# Patient Record
Sex: Female | Born: 1955 | Hispanic: No | Marital: Married | Smoking: Never smoker
Health system: Southern US, Community
[De-identification: ages and names within clinical notes are randomized; demographics above are authoritative.]

## PROBLEM LIST (undated history)

## (undated) DIAGNOSIS — E119 Type 2 diabetes mellitus without complications: Secondary | ICD-10-CM

## (undated) DIAGNOSIS — I10 Essential (primary) hypertension: Secondary | ICD-10-CM

## (undated) DIAGNOSIS — G4730 Sleep apnea, unspecified: Secondary | ICD-10-CM

## (undated) DIAGNOSIS — E079 Disorder of thyroid, unspecified: Secondary | ICD-10-CM

## (undated) HISTORY — PX: TENDON REPAIR: SHX5111

## (undated) HISTORY — DX: Type 2 diabetes mellitus without complications: E11.9

## (undated) HISTORY — DX: Sleep apnea, unspecified: G47.30

## (undated) HISTORY — DX: Disorder of thyroid, unspecified: E07.9

## (undated) HISTORY — DX: Essential (primary) hypertension: I10

---

## 2010-10-25 ENCOUNTER — Encounter (HOSPITAL_BASED_OUTPATIENT_CLINIC_OR_DEPARTMENT_OTHER)
Admission: RE | Admit: 2010-10-25 | Discharge: 2010-10-25 | Disposition: A | Discharge status: Home / Self Care | Payer: Self-pay | Source: Ambulatory Visit | Attending: General Surgery | Admitting: General Surgery

## 2010-10-25 DIAGNOSIS — Z01818 Encounter for other preprocedural examination: Secondary | ICD-10-CM | POA: Insufficient documentation

## 2010-10-25 DIAGNOSIS — Z01812 Encounter for preprocedural laboratory examination: Secondary | ICD-10-CM | POA: Insufficient documentation

## 2010-10-25 LAB — BASIC METABOLIC PANEL
CO2: 30 mEq/L (ref 19–32)
Calcium: 8.9 mg/dL (ref 8.4–10.5)
Chloride: 100 mEq/L (ref 96–112)
Glucose, Bld: 74 mg/dL (ref 70–99)
Potassium: 3.2 mEq/L — ABNORMAL LOW (ref 3.5–5.1)
Sodium: 137 mEq/L (ref 135–145)

## 2010-10-26 ENCOUNTER — Ambulatory Visit (HOSPITAL_BASED_OUTPATIENT_CLINIC_OR_DEPARTMENT_OTHER)
Admission: RE | Admit: 2010-10-26 | Discharge: 2010-10-27 | Disposition: A | Discharge status: Home / Self Care | Payer: Self-pay | Source: Ambulatory Visit | Attending: General Surgery | Admitting: General Surgery

## 2010-10-26 ENCOUNTER — Other Ambulatory Visit (INDEPENDENT_AMBULATORY_CARE_PROVIDER_SITE_OTHER): Payer: Self-pay | Admitting: General Surgery

## 2010-10-26 DIAGNOSIS — Z01812 Encounter for preprocedural laboratory examination: Secondary | ICD-10-CM | POA: Insufficient documentation

## 2010-10-26 DIAGNOSIS — D1739 Benign lipomatous neoplasm of skin and subcutaneous tissue of other sites: Secondary | ICD-10-CM

## 2010-10-26 DIAGNOSIS — J45909 Unspecified asthma, uncomplicated: Secondary | ICD-10-CM | POA: Insufficient documentation

## 2010-10-26 DIAGNOSIS — I10 Essential (primary) hypertension: Secondary | ICD-10-CM | POA: Insufficient documentation

## 2010-10-26 DIAGNOSIS — Z01810 Encounter for preprocedural cardiovascular examination: Secondary | ICD-10-CM | POA: Insufficient documentation

## 2010-10-26 LAB — POCT HEMOGLOBIN-HEMACUE: Hemoglobin: 13.3 g/dL (ref 12.0–15.0)

## 2010-11-07 NOTE — Op Note (Signed)
  [name], Brenda Adkins             ACCOUNT NO.:  [id number]  MEDICAL RECORD NO.:  [id number]  LOCATION:                                 FACILITY:  PHYSICIAN:  Lennie Muckle, MD      DATE OF BIRTH:  [birth year]  DATE OF PROCEDURE: DATE OF DISCHARGE:                              OPERATIVE REPORT   PREOPERATIVE DIAGNOSIS:  Lipoma, left thigh.  POSTOPERATIVE DIAGNOSIS:  Lipoma, left thigh.  PROCEDURE:  Excision of lipoma, left thigh.  SURGEON:  Lennie Muckle, MD  ASSISTANT:  OR staff.  FINDINGS:  A 2-cm lipoma on the left thigh, sent for permanent.  COMPLICATIONS:  No immediate complications.  BLOOD LOSS.:  Minimal amount of blood loss.  INDICATIONS FOR PROCEDURE:  [name] is a 55 year old female who had a growth on her left thigh for over a year.  It had grown slightly in size.  I had seen her last year for excision and I think it was financial difficulty that she had that she did not have this excised previously.  She states she is still bothered by the lesion and wants to remove due to the irritability.  DETAILS OF PROCEDURE:  The patient was identified in the preoperative holding area.  I marked the area while the patient awake and conversation with her daughters who were interpreting.  She was seen by Anesthesia and taken to the operating room.  Once in the operating room, she was placed in supine position.  She was placed under general endotracheal anesthesia.  Her left thigh was prepped and draped in the usual sterile fashion.  A surgical time-out was performed.  I began by placing an elliptical incision around the lipoma site.  I incorporated the small portion of the skin..  I then used electrocautery to remove the specimen intact.  The specimen was passed off the operative field. There was minimal amount of oozing.  I used electrocautery to control this.  I then reapproximated the subcutaneous tissues with a 3-0 Vicryl. Dermis was closed with 0 Vicryl.  I  placed Steri-Strips and then placed dry gauze Tegaderm.  I injected 10 mL of 0.25% Marcaine with epinephrine for local block.  The patient tolerated the procedure well and was extubated and taken to the Postanesthesia Care Unit in stable condition.  She will be discharged home with Vicodin and will follow up in 2-3 weeks for a postoperative visit.     Lennie Muckle, MD     ALA/MEDQ  D:  10/26/2010  T:  10/27/2010  Job:  [id number]  Electronically Signed by Bertram Savin MD on 11/07/2010 11:42:37 AM

## 2010-11-09 ENCOUNTER — Encounter (INDEPENDENT_AMBULATORY_CARE_PROVIDER_SITE_OTHER): Payer: Self-pay | Admitting: General Surgery

## 2010-11-09 ENCOUNTER — Ambulatory Visit (INDEPENDENT_AMBULATORY_CARE_PROVIDER_SITE_OTHER): Payer: Self-pay | Admitting: General Surgery

## 2010-11-09 VITALS — Temp 97.4°F

## 2010-11-09 DIAGNOSIS — D1779 Benign lipomatous neoplasm of other sites: Secondary | ICD-10-CM

## 2010-11-09 DIAGNOSIS — D1720 Benign lipomatous neoplasm of skin and subcutaneous tissue of unspecified limb: Secondary | ICD-10-CM | POA: Insufficient documentation

## 2010-11-09 NOTE — Progress Notes (Signed)
Subjective:     Patient ID: Brenda Adkins, female   DOB: [birth year], 55 y.o.   MRN: [id number]    Temp(Src) 97.4 F (36.3 C) (Temporal)    HPI Doing well. No pain.  Review of Systems     Objective:   Physical Exam Wound on l thigh has no signs of infection and is healing well    Assessment:     Doing well from L thigh lipoma excision.    Plan:     Follow up PRN

## 2015-02-28 ENCOUNTER — Other Ambulatory Visit (HOSPITAL_COMMUNITY)
Admission: RE | Admit: 2015-02-28 | Discharge: 2015-02-28 | Disposition: A | Discharge status: Home / Self Care | Payer: 59 | Source: Ambulatory Visit | Attending: Obstetrics & Gynecology | Admitting: Obstetrics & Gynecology

## 2015-02-28 ENCOUNTER — Encounter: Payer: Self-pay | Admitting: Obstetrics & Gynecology

## 2015-02-28 ENCOUNTER — Ambulatory Visit (INDEPENDENT_AMBULATORY_CARE_PROVIDER_SITE_OTHER): Payer: 59 | Admitting: Obstetrics & Gynecology

## 2015-02-28 VITALS — BP 117/76 | HR 89 | Ht 60.0 in | Wt 218.0 lb

## 2015-02-28 DIAGNOSIS — Z1231 Encounter for screening mammogram for malignant neoplasm of breast: Secondary | ICD-10-CM

## 2015-02-28 DIAGNOSIS — Z113 Encounter for screening for infections with a predominantly sexual mode of transmission: Secondary | ICD-10-CM

## 2015-02-28 DIAGNOSIS — N950 Postmenopausal bleeding: Secondary | ICD-10-CM | POA: Insufficient documentation

## 2015-02-28 DIAGNOSIS — Z1151 Encounter for screening for human papillomavirus (HPV): Secondary | ICD-10-CM | POA: Diagnosis not present

## 2015-02-28 DIAGNOSIS — Z01419 Encounter for gynecological examination (general) (routine) without abnormal findings: Secondary | ICD-10-CM

## 2015-02-28 DIAGNOSIS — Z124 Encounter for screening for malignant neoplasm of cervix: Secondary | ICD-10-CM

## 2015-02-28 NOTE — Progress Notes (Signed)
GYNECOLOGY CLINIC ANNUAL PREVENTATIVE CARE ENCOUNTER NOTE  Subjective:   Brenda Adkins is a 59 y.o. PMP  female here for a routine annual gynecologic exam.  Current complaints: postmenopausal bleeding.  Had menopause around 2005; no bleeding or any gynecologic problems until last month when she had an episode of bleeding that lasted for four days.  Bleeding was moderate; used 2 pads/day. Associated with cramping.  Bleeding stopped since then; denies current abnormal vaginal bleeding, discharge, pelvic pain, problems with intercourse or other gynecologic concerns.    Gynecologic History No LMP recorded. Patient is postmenopausal. Contraception: post menopausal status Last Pap: 24 years ago. Results were: normal Last mammogram: several years ago. Results were: normal  Obstetric History OB History  No data available    Past Medical History  Diagnosis Date  . Hypertension   . Diabetes mellitus without complication [facility])     Past Surgical History  Procedure Laterality Date  . Tendon repair Right     Current Outpatient Prescriptions on File Prior to Visit  Medication Sig Dispense Refill  . losartan-hydrochlorothiazide (HYZAAR) 100-12.5 MG per tablet Take 1 tablet by mouth daily.      No current facility-administered medications on file prior to visit.    No Known Allergies  Social History   Social History  . Marital Status: Married    Spouse Name: N/A  . Number of Children: N/A  . Years of Education: N/A   Occupational History  . Not on file.   Social History Main Topics  . Smoking status: Never Smoker   . Smokeless tobacco: Not on file  . Alcohol Use: No  . Drug Use: No  . Sexual Activity: Not on file   Other Topics Concern  . Not on file   Social History Narrative    Family History  Problem Relation Age of Onset  . Hypertension Mother   . Diabetes Mother   . Cancer Neg Hx     The following portions of the patient's history were reviewed and  updated as appropriate: allergies, current medications, past family history, past medical history, past social history, past surgical history and problem list.  Review of Systems Pertinent items noted in HPI and remainder of comprehensive ROS otherwise negative.   Objective:  BP 117/76 mmHg  Pulse 89  Ht 5' (1.524 m)  Wt 218 lb (98.884 kg)  BMI 42.58 kg/m2 CONSTITUTIONAL: Well-developed, well-nourished female in no acute distress.  HENT:  Normocephalic, atraumatic, External right and left ear normal. Oropharynx is clear and moist EYES: Conjunctivae and EOM are normal. Pupils are equal, round, and reactive to light. No scleral icterus.  NECK: Normal range of motion, supple, no masses.  Normal thyroid.  SKIN: Skin is warm and dry. No rash noted. Not diaphoretic. No erythema. No pallor. [place]: Alert and oriented to person, place, and time. Normal reflexes, muscle tone coordination. No cranial nerve deficit noted. PSYCHIATRIC: Normal mood and affect. Normal behavior. Normal judgment and thought content. CARDIOVASCULAR: Normal heart rate noted, regular rhythm RESPIRATORY: Clear to auscultation bilaterally. Effort and breath sounds normal, no problems with respiration noted. BREASTS: Symmetric in size. No masses, skin changes, nipple drainage, or lymphadenopathy. ABDOMEN: Soft, obese, normal bowel sounds, no distention appreciated.  No tenderness, rebound or guarding.  PELVIC: Normal appearing external genitalia; normal appearing vaginal mucosa and cervix.  Normal appearing discharge.  Pap smear obtained.  Unable to palpate uterus or adnexa secondary to habitus.  MUSCULOSKELETAL: Normal range of motion. No tenderness.  No  cyanosis, clubbing, or edema.  2+ distal pulses.  ENDOMETRIAL BIOPSY     The indications for endometrial biopsy were reviewed.   Risks of the biopsy including cramping, bleeding, infection, uterine perforation, inadequate specimen and need for additional procedures  were  discussed. The patient states she understands and agrees to undergo procedure today. Consent was signed. Time out was performed.  During the pelvic exam, the cervix was prepped with Betadine. A single-toothed tenaculum was placed on the anterior lip of the cervix to stabilize it. The 3 mm pipelle was introduced into the endometrial cavity without difficulty to a depth of 8 cm, and a scant amount of tissue was obtained after two passes and sent to pathology. The instruments were removed from the patient's vagina. Minimal bleeding from the cervix was noted. The patient tolerated the procedure well. Routine post-procedure instructions were given to the patient.     Assessment:  Annual gynecologic examination with pap smear Postmenopausal bleeding   Plan:  Discussed etiologies of postmenopausal bleeding, concern about precancerous/hyperplasia or cancerous etiology (5 to 10% percent of cases).   However, she was reassured that endometrial atrophy and endometrial polyps are the most common causes of postmenopausal bleeding.  Uterine bleeding in postmenopausal women is usually light and self-limited. Exclusion of cancer is the main objective; therefore, treatment is usually unnecessary once cancer has been excluded.  The primary goal in the diagnostic evaluation of postmenopausal women with uterine bleeding is to exclude malignancy; this can include endometrial biopsy and pelvic ultrasound.   Further diagnostic evaluation is indicated for recurrent or persistent bleeding  Will follow up results of pap smear and endometrial biopsy and manage accordingly. Pelvic ultrasound and mammogram scheduled, will follow up results and manage accordingly. Routine preventative health maintenance measures emphasized; will follow up with PCP.Marland Kitchen Please refer to After Visit Summary for other counseling recommendations.    Verita Schneiders, MD, [place] Attending Obstetrician & Gynecologist, [place] [facility]

## 2015-02-28 NOTE — Patient Instructions (Signed)
Preventive Care for Adults, Female A healthy lifestyle and preventive care can promote health and wellness. Preventive health guidelines for women include the following key practices.  A routine yearly physical is a good way to check with your health care provider about your health and preventive screening. It is a chance to share any concerns and updates on your health and to receive a thorough exam.  Visit your dentist for a routine exam and preventive care every 6 months. Brush your teeth twice a day and floss once a day. Good oral hygiene prevents tooth decay and gum disease.  The frequency of eye exams is based on your age, health, family medical history, use of contact lenses, and other factors. Follow your health care provider's recommendations for frequency of eye exams.  Eat a healthy diet. Foods like vegetables, fruits, whole grains, low-fat dairy products, and lean protein foods contain the nutrients you need without too many calories. Decrease your intake of foods high in solid fats, added sugars, and salt. Eat the right amount of calories for you.Get information about a proper diet from your health care provider, if necessary.  Regular physical exercise is one of the most important things you can do for your health. Most adults should get at least 150 minutes of moderate-intensity exercise (any activity that increases your heart rate and causes you to sweat) each week. In addition, most adults need muscle-strengthening exercises on 2 or more days a week.  Maintain a healthy weight. The body mass index (BMI) is a screening tool to identify possible weight problems. It provides an estimate of body fat based on height and weight. Your health care provider can find your BMI and can help you achieve or maintain a healthy weight.For adults 20 years and older:  A BMI below 18.5 is considered underweight.  A BMI of 18.5 to 24.9 is normal.  A BMI of 25 to 29.9 is considered overweight.  A  BMI of 30 and above is considered obese.  Maintain normal blood lipids and cholesterol levels by exercising and minimizing your intake of saturated fat. Eat a balanced diet with plenty of fruit and vegetables. Blood tests for lipids and cholesterol should begin at age 45 and be repeated every 5 years. If your lipid or cholesterol levels are high, you are over 50, or you are at high risk for heart disease, you may need your cholesterol levels checked more frequently.Ongoing high lipid and cholesterol levels should be treated with medicines if diet and exercise are not working.  If you smoke, find out from your health care provider how to quit. If you do not use tobacco, do not start.  Lung cancer screening is recommended for adults aged 45-80 years who are at high risk for developing lung cancer because of a history of smoking. A yearly low-dose CT scan of the lungs is recommended for people who have at least a 30-pack-year history of smoking and are a current smoker or have quit within the past 15 years. A pack year of smoking is smoking an average of 1 pack of cigarettes a day for 1 year (for example: 1 pack a day for 30 years or 2 packs a day for 15 years). Yearly screening should continue until the smoker has stopped smoking for at least 15 years. Yearly screening should be stopped for people who develop a health problem that would prevent them from having lung cancer treatment.  If you are pregnant, do not drink alcohol. If you are  breastfeeding, be very cautious about drinking alcohol. If you are not pregnant and choose to drink alcohol, do not have more than 1 drink per day. One drink is considered to be 12 ounces (355 mL) of beer, 5 ounces (148 mL) of wine, or 1.5 ounces (44 mL) of liquor.  Avoid use of street drugs. Do not share needles with anyone. Ask for help if you need support or instructions about stopping the use of drugs.  High blood pressure causes heart disease and increases the risk  of stroke. Your blood pressure should be checked at least every 1 to 2 years. Ongoing high blood pressure should be treated with medicines if weight loss and exercise do not work.  If you are 55-79 years old, ask your health care provider if you should take aspirin to prevent strokes.  Diabetes screening is done by taking a blood sample to check your blood glucose level after you have not eaten for a certain period of time (fasting). If you are not overweight and you do not have risk factors for diabetes, you should be screened once every 3 years starting at age 45. If you are overweight or obese and you are 40-70 years of age, you should be screened for diabetes every year as part of your cardiovascular risk assessment.  Breast cancer screening is essential preventive care for women. You should practice "breast self-awareness." This means understanding the normal appearance and feel of your breasts and may include breast self-examination. Any changes detected, no matter how small, should be reported to a health care provider. Women in their 20s and 30s should have a clinical breast exam (CBE) by a health care provider as part of a regular health exam every 1 to 3 years. After age 40, women should have a CBE every year. Starting at age 40, women should consider having a mammogram (breast X-ray test) every year. Women who have a family history of breast cancer should talk to their health care provider about genetic screening. Women at a high risk of breast cancer should talk to their health care providers about having an MRI and a mammogram every year.  Breast cancer gene (BRCA)-related cancer risk assessment is recommended for women who have family members with BRCA-related cancers. BRCA-related cancers include breast, ovarian, tubal, and peritoneal cancers. Having family members with these cancers may be associated with an increased risk for harmful changes (mutations) in the breast cancer genes BRCA1 and  BRCA2. Results of the assessment will determine the need for genetic counseling and BRCA1 and BRCA2 testing.  Your health care provider may recommend that you be screened regularly for cancer of the pelvic organs (ovaries, uterus, and vagina). This screening involves a pelvic examination, including checking for microscopic changes to the surface of your cervix (Pap test). You may be encouraged to have this screening done every 3 years, beginning at age 21.  For women ages 30-65, health care providers may recommend pelvic exams and Pap testing every 3 years, or they may recommend the Pap and pelvic exam, combined with testing for human papilloma virus (HPV), every 5 years. Some types of HPV increase your risk of cervical cancer. Testing for HPV may also be done on women of any age with unclear Pap test results.  Other health care providers may not recommend any screening for nonpregnant women who are considered low risk for pelvic cancer and who do not have symptoms. Ask your health care provider if a screening pelvic exam is right for   you.  If you have had past treatment for cervical cancer or a condition that could lead to cancer, you need Pap tests and screening for cancer for at least 20 years after your treatment. If Pap tests have been discontinued, your risk factors (such as having a new sexual partner) need to be reassessed to determine if screening should resume. Some women have medical problems that increase the chance of getting cervical cancer. In these cases, your health care provider may recommend more frequent screening and Pap tests.  Colorectal cancer can be detected and often prevented. Most routine colorectal cancer screening begins at the age of 50 years and continues through age 75 years. However, your health care provider may recommend screening at an earlier age if you have risk factors for colon cancer. On a yearly basis, your health care provider may provide home test kits to check  for hidden blood in the stool. Use of a small camera at the end of a tube, to directly examine the colon (sigmoidoscopy or colonoscopy), can detect the earliest forms of colorectal cancer. Talk to your health care provider about this at age 50, when routine screening begins. Direct exam of the colon should be repeated every 5-10 years through age 75 years, unless early forms of precancerous polyps or small growths are found.  People who are at an increased risk for hepatitis B should be screened for this virus. You are considered at high risk for hepatitis B if:  You were born in a country where hepatitis B occurs often. Talk with your health care provider about which countries are considered high risk.  Your parents were born in a high-risk country and you have not received a shot to protect against hepatitis B (hepatitis B vaccine).  You have HIV or AIDS.  You use needles to inject street drugs.  You live with, or have sex with, someone who has hepatitis B.  You get hemodialysis treatment.  You take certain medicines for conditions like cancer, organ transplantation, and autoimmune conditions.  Hepatitis C blood testing is recommended for all people born from 1945 through 1965 and any individual with known risks for hepatitis C.  Practice safe sex. Use condoms and avoid high-risk sexual practices to reduce the spread of sexually transmitted infections (STIs). STIs include gonorrhea, chlamydia, syphilis, trichomonas, herpes, HPV, and human immunodeficiency virus (HIV). Herpes, HIV, and HPV are viral illnesses that have no cure. They can result in disability, cancer, and death.  You should be screened for sexually transmitted illnesses (STIs) including gonorrhea and chlamydia if:  You are sexually active and are younger than 24 years.  You are older than 24 years and your health care provider tells you that you are at risk for this type of infection.  Your sexual activity has changed  since you were last screened and you are at an increased risk for chlamydia or gonorrhea. Ask your health care provider if you are at risk.  If you are at risk of being infected with HIV, it is recommended that you take a prescription medicine daily to prevent HIV infection. This is called preexposure prophylaxis (PrEP). You are considered at risk if:  You are sexually active and do not regularly use condoms or know the HIV status of your partner(s).  You take drugs by injection.  You are sexually active with a partner who has HIV.  Talk with your health care provider about whether you are at high risk of being infected with HIV. If   you choose to begin PrEP, you should first be tested for HIV. You should then be tested every 3 months for as long as you are taking PrEP.  Osteoporosis is a disease in which the bones lose minerals and strength with aging. This can result in serious bone fractures or breaks. The risk of osteoporosis can be identified using a bone density scan. Women ages 67 years and over and women at risk for fractures or osteoporosis should discuss screening with their health care providers. Ask your health care provider whether you should take a calcium supplement or vitamin D to reduce the rate of osteoporosis.  Menopause can be associated with physical symptoms and risks. Hormone replacement therapy is available to decrease symptoms and risks. You should talk to your health care provider about whether hormone replacement therapy is right for you.  Use sunscreen. Apply sunscreen liberally and repeatedly throughout the day. You should seek shade when your shadow is shorter than you. Protect yourself by wearing long sleeves, pants, a wide-brimmed hat, and sunglasses year round, whenever you are outdoors.  Once a month, do a whole body skin exam, using a mirror to look at the skin on your back. Tell your health care provider of new moles, moles that have irregular borders, moles that  are larger than a pencil eraser, or moles that have changed in shape or color.  Stay current with required vaccines (immunizations).  Influenza vaccine. All adults should be immunized every year.  Tetanus, diphtheria, and acellular pertussis (Td, Tdap) vaccine. Pregnant women should receive 1 dose of Tdap vaccine during each pregnancy. The dose should be obtained regardless of the length of time since the last dose. Immunization is preferred during the 27th-36th week of gestation. An adult who has not previously received Tdap or who does not know her vaccine status should receive 1 dose of Tdap. This initial dose should be followed by tetanus and diphtheria toxoids (Td) booster doses every 10 years. Adults with an unknown or incomplete history of completing a 3-dose immunization series with Td-containing vaccines should begin or complete a primary immunization series including a Tdap dose. Adults should receive a Td booster every 10 years.  Varicella vaccine. An adult without evidence of immunity to varicella should receive 2 doses or a second dose if she has previously received 1 dose. Pregnant females who do not have evidence of immunity should receive the first dose after pregnancy. This first dose should be obtained before leaving the health care facility. The second dose should be obtained 4-8 weeks after the first dose.  Human papillomavirus (HPV) vaccine. Females aged 13-26 years who have not received the vaccine previously should obtain the 3-dose series. The vaccine is not recommended for use in pregnant females. However, pregnancy testing is not needed before receiving a dose. If a female is found to be pregnant after receiving a dose, no treatment is needed. In that case, the remaining doses should be delayed until after the pregnancy. Immunization is recommended for any person with an immunocompromised condition through the age of 61 years if she did not get any or all doses earlier. During the  3-dose series, the second dose should be obtained 4-8 weeks after the first dose. The third dose should be obtained 24 weeks after the first dose and 16 weeks after the second dose.  Zoster vaccine. One dose is recommended for adults aged 30 years or older unless certain conditions are present.  Measles, mumps, and rubella (MMR) vaccine. Adults born  before 1957 generally are considered immune to measles and mumps. Adults born in 1957 or later should have 1 or more doses of MMR vaccine unless there is a contraindication to the vaccine or there is laboratory evidence of immunity to each of the three diseases. A routine second dose of MMR vaccine should be obtained at least 28 days after the first dose for students attending postsecondary schools, health care workers, or international travelers. People who received inactivated measles vaccine or an unknown type of measles vaccine during 1963-1967 should receive 2 doses of MMR vaccine. People who received inactivated mumps vaccine or an unknown type of mumps vaccine before 1979 and are at high risk for mumps infection should consider immunization with 2 doses of MMR vaccine. For females of childbearing age, rubella immunity should be determined. If there is no evidence of immunity, females who are not pregnant should be vaccinated. If there is no evidence of immunity, females who are pregnant should delay immunization until after pregnancy. Unvaccinated health care workers born before 1957 who lack laboratory evidence of measles, mumps, or rubella immunity or laboratory confirmation of disease should consider measles and mumps immunization with 2 doses of MMR vaccine or rubella immunization with 1 dose of MMR vaccine.  Pneumococcal 13-valent conjugate (PCV13) vaccine. When indicated, a person who is uncertain of his immunization history and has no record of immunization should receive the PCV13 vaccine. All adults 65 years of age and older should receive this  vaccine. An adult aged 19 years or older who has certain medical conditions and has not been previously immunized should receive 1 dose of PCV13 vaccine. This PCV13 should be followed with a dose of pneumococcal polysaccharide (PPSV23) vaccine. Adults who are at high risk for pneumococcal disease should obtain the PPSV23 vaccine at least 8 weeks after the dose of PCV13 vaccine. Adults older than 59 years of age who have normal immune system function should obtain the PPSV23 vaccine dose at least 1 year after the dose of PCV13 vaccine.  Pneumococcal polysaccharide (PPSV23) vaccine. When PCV13 is also indicated, PCV13 should be obtained first. All adults aged 65 years and older should be immunized. An adult younger than age 65 years who has certain medical conditions should be immunized. Any person who resides in a nursing home or long-term care facility should be immunized. An adult smoker should be immunized. People with an immunocompromised condition and certain other conditions should receive both PCV13 and PPSV23 vaccines. People with human immunodeficiency virus (HIV) infection should be immunized as soon as possible after diagnosis. Immunization during chemotherapy or radiation therapy should be avoided. Routine use of PPSV23 vaccine is not recommended for American Indians, [place] Natives, or people younger than 65 years unless there are medical conditions that require PPSV23 vaccine. When indicated, people who have unknown immunization and have no record of immunization should receive PPSV23 vaccine. One-time revaccination 5 years after the first dose of PPSV23 is recommended for people aged 19-64 years who have chronic kidney failure, nephrotic syndrome, asplenia, or immunocompromised conditions. People who received 1-2 doses of PPSV23 before age 65 years should receive another dose of PPSV23 vaccine at age 65 years or later if at least 5 years have passed since the previous dose. Doses of PPSV23 are not  needed for people immunized with PPSV23 at or after age 65 years.  Meningococcal vaccine. Adults with asplenia or persistent complement component deficiencies should receive 2 doses of quadrivalent meningococcal conjugate (MenACWY-D) vaccine. The doses should be obtained   at least 2 months apart. Microbiologists working with certain meningococcal bacteria, [place] recruits, people at risk during an outbreak, and people who travel to or live in countries with a high rate of meningitis should be immunized. A first-year college student up through age 34 years who is living in a residence hall should receive a dose if she did not receive a dose on or after her 16th birthday. Adults who have certain high-risk conditions should receive one or more doses of vaccine.  Hepatitis A vaccine. Adults who wish to be protected from this disease, have certain high-risk conditions, work with hepatitis A-infected animals, work in hepatitis A research labs, or travel to or work in countries with a high rate of hepatitis A should be immunized. Adults who were previously unvaccinated and who anticipate close contact with an international adoptee during the first 60 days after arrival in the [place] from a country with a high rate of hepatitis A should be immunized.  Hepatitis B vaccine. Adults who wish to be protected from this disease, have certain high-risk conditions, may be exposed to blood or other infectious body fluids, are household contacts or sex partners of hepatitis B positive people, are clients or workers in certain care facilities, or travel to or work in countries with a high rate of hepatitis B should be immunized.  Haemophilus influenzae type b (Hib) vaccine. A previously unvaccinated person with asplenia or sickle cell disease or having a scheduled splenectomy should receive 1 dose of Hib vaccine. Regardless of previous immunization, a recipient of a hematopoietic stem cell transplant should receive a  3-dose series 6-12 months after her successful transplant. Hib vaccine is not recommended for adults with HIV infection. Preventive Services / Frequency Ages 35 to 4 years  Blood pressure check.** / Every 3-5 years.  Lipid and cholesterol check.** / Every 5 years beginning at age 60.  Clinical breast exam.** / Every 3 years for women in their 71s and 10s.  BRCA-related cancer risk assessment.** / For women who have family members with a BRCA-related cancer (breast, ovarian, tubal, or peritoneal cancers).  Pap test.** / Every 2 years from ages 76 through 26. Every 3 years starting at age 61 through age 76 or 93 with a history of 3 consecutive normal Pap tests.  HPV screening.** / Every 3 years from ages 37 through ages 60 to 51 with a history of 3 consecutive normal Pap tests.  Hepatitis C blood test.** / For any individual with known risks for hepatitis C.  Skin self-exam. / Monthly.  Influenza vaccine. / Every year.  Tetanus, diphtheria, and acellular pertussis (Tdap, Td) vaccine.** / Consult your health care provider. Pregnant women should receive 1 dose of Tdap vaccine during each pregnancy. 1 dose of Td every 10 years.  Varicella vaccine.** / Consult your health care provider. Pregnant females who do not have evidence of immunity should receive the first dose after pregnancy.  HPV vaccine. / 3 doses over 6 months, if 93 and younger. The vaccine is not recommended for use in pregnant females. However, pregnancy testing is not needed before receiving a dose.  Measles, mumps, rubella (MMR) vaccine.** / You need at least 1 dose of MMR if you were born in 1957 or later. You may also need a 2nd dose. For females of childbearing age, rubella immunity should be determined. If there is no evidence of immunity, females who are not pregnant should be vaccinated. If there is no evidence of immunity, females who are  pregnant should delay immunization until after pregnancy.  Pneumococcal  13-valent conjugate (PCV13) vaccine.** / Consult your health care provider.  Pneumococcal polysaccharide (PPSV23) vaccine.** / 1 to 2 doses if you smoke cigarettes or if you have certain conditions.  Meningococcal vaccine.** / 1 dose if you are age 68 to 8 years and a Market researcher living in a residence hall, or have one of several medical conditions, you need to get vaccinated against meningococcal disease. You may also need additional booster doses.  Hepatitis A vaccine.** / Consult your health care provider.  Hepatitis B vaccine.** / Consult your health care provider.  Haemophilus influenzae type b (Hib) vaccine.** / Consult your health care provider. Ages 7 to 53 years  Blood pressure check.** / Every year.  Lipid and cholesterol check.** / Every 5 years beginning at age 25 years.  Lung cancer screening. / Every year if you are aged 11-80 years and have a 30-pack-year history of smoking and currently smoke or have quit within the past 15 years. Yearly screening is stopped once you have quit smoking for at least 15 years or develop a health problem that would prevent you from having lung cancer treatment.  Clinical breast exam.** / Every year after age 48 years.  BRCA-related cancer risk assessment.** / For women who have family members with a BRCA-related cancer (breast, ovarian, tubal, or peritoneal cancers).  Mammogram.** / Every year beginning at age 41 years and continuing for as long as you are in good health. Consult with your health care provider.  Pap test.** / Every 3 years starting at age 65 years through age 37 or 70 years with a history of 3 consecutive normal Pap tests.  HPV screening.** / Every 3 years from ages 72 years through ages 60 to 40 years with a history of 3 consecutive normal Pap tests.  Fecal occult blood test (FOBT) of stool. / Every year beginning at age 21 years and continuing until age 5 years. You may not need to do this test if you get  a colonoscopy every 10 years.  Flexible sigmoidoscopy or colonoscopy.** / Every 5 years for a flexible sigmoidoscopy or every 10 years for a colonoscopy beginning at age 35 years and continuing until age 48 years.  Hepatitis C blood test.** / For all people born from [birth year] through 1965 and any individual with known risks for hepatitis C.  Skin self-exam. / Monthly.  Influenza vaccine. / Every year.  Tetanus, diphtheria, and acellular pertussis (Tdap/Td) vaccine.** / Consult your health care provider. Pregnant women should receive 1 dose of Tdap vaccine during each pregnancy. 1 dose of Td every 10 years.  Varicella vaccine.** / Consult your health care provider. Pregnant females who do not have evidence of immunity should receive the first dose after pregnancy.  Zoster vaccine.** / 1 dose for adults aged 30 years or older.  Measles, mumps, rubella (MMR) vaccine.** / You need at least 1 dose of MMR if you were born in 1957 or later. You may also need a second dose. For females of childbearing age, rubella immunity should be determined. If there is no evidence of immunity, females who are not pregnant should be vaccinated. If there is no evidence of immunity, females who are pregnant should delay immunization until after pregnancy.  Pneumococcal 13-valent conjugate (PCV13) vaccine.** / Consult your health care provider.  Pneumococcal polysaccharide (PPSV23) vaccine.** / 1 to 2 doses if you smoke cigarettes or if you have certain conditions.  Meningococcal vaccine.** /  Consult your health care provider.  Hepatitis A vaccine.** / Consult your health care provider.  Hepatitis B vaccine.** / Consult your health care provider.  Haemophilus influenzae type b (Hib) vaccine.** / Consult your health care provider. Ages 64 years and over  Blood pressure check.** / Every year.  Lipid and cholesterol check.** / Every 5 years beginning at age 23 years.  Lung cancer screening. / Every year if you  are aged 16-80 years and have a 30-pack-year history of smoking and currently smoke or have quit within the past 15 years. Yearly screening is stopped once you have quit smoking for at least 15 years or develop a health problem that would prevent you from having lung cancer treatment.  Clinical breast exam.** / Every year after age 74 years.  BRCA-related cancer risk assessment.** / For women who have family members with a BRCA-related cancer (breast, ovarian, tubal, or peritoneal cancers).  Mammogram.** / Every year beginning at age 44 years and continuing for as long as you are in good health. Consult with your health care provider.  Pap test.** / Every 3 years starting at age 58 years through age 22 or 39 years with 3 consecutive normal Pap tests. Testing can be stopped between 65 and 70 years with 3 consecutive normal Pap tests and no abnormal Pap or HPV tests in the past 10 years.  HPV screening.** / Every 3 years from ages 64 years through ages 70 or 61 years with a history of 3 consecutive normal Pap tests. Testing can be stopped between 65 and 70 years with 3 consecutive normal Pap tests and no abnormal Pap or HPV tests in the past 10 years.  Fecal occult blood test (FOBT) of stool. / Every year beginning at age 40 years and continuing until age 27 years. You may not need to do this test if you get a colonoscopy every 10 years.  Flexible sigmoidoscopy or colonoscopy.** / Every 5 years for a flexible sigmoidoscopy or every 10 years for a colonoscopy beginning at age 7 years and continuing until age 32 years.  Hepatitis C blood test.** / For all people born from [birth year] through 1965 and any individual with known risks for hepatitis C.  Osteoporosis screening.** / A one-time screening for women ages 30 years and over and women at risk for fractures or osteoporosis.  Skin self-exam. / Monthly.  Influenza vaccine. / Every year.  Tetanus, diphtheria, and acellular pertussis (Tdap/Td)  vaccine.** / 1 dose of Td every 10 years.  Varicella vaccine.** / Consult your health care provider.  Zoster vaccine.** / 1 dose for adults aged 35 years or older.  Pneumococcal 13-valent conjugate (PCV13) vaccine.** / Consult your health care provider.  Pneumococcal polysaccharide (PPSV23) vaccine.** / 1 dose for all adults aged 46 years and older.  Meningococcal vaccine.** / Consult your health care provider.  Hepatitis A vaccine.** / Consult your health care provider.  Hepatitis B vaccine.** / Consult your health care provider.  Haemophilus influenzae type b (Hib) vaccine.** / Consult your health care provider. ** Family history and personal history of risk and conditions may change your health care provider's recommendations.   This information is not intended to replace advice given to you by your health care provider. Make sure you discuss any questions you have with your health care provider.   Document Released: 06/12/2001 Document Revised: 05/07/2014 Document Reviewed: 09/11/2010 Elsevier Interactive Patient Education [employer].

## 2015-03-01 ENCOUNTER — Ambulatory Visit (HOSPITAL_BASED_OUTPATIENT_CLINIC_OR_DEPARTMENT_OTHER)
Admission: RE | Admit: 2015-03-01 | Discharge: 2015-03-01 | Disposition: A | Discharge status: Home / Self Care | Payer: 59 | Source: Ambulatory Visit | Attending: Obstetrics & Gynecology | Admitting: Obstetrics & Gynecology

## 2015-03-01 ENCOUNTER — Telehealth: Payer: Self-pay

## 2015-03-01 DIAGNOSIS — Z1231 Encounter for screening mammogram for malignant neoplasm of breast: Secondary | ICD-10-CM | POA: Insufficient documentation

## 2015-03-01 DIAGNOSIS — N950 Postmenopausal bleeding: Secondary | ICD-10-CM | POA: Diagnosis present

## 2015-03-01 NOTE — Telephone Encounter (Signed)
-----   Message from Osborne Oman, MD sent at 03/01/2015 11:52 AM EDT ----- Normal ultrasound.  No further action needed.  All benign characteristics. Will reevaluate if bleeding continues.  Please call to inform patient of results and recommendations.

## 2015-03-01 NOTE — Progress Notes (Signed)
See telephone not where biopsy and ultrasound results given to patient

## 2015-03-01 NOTE — Telephone Encounter (Signed)
Patient called and she then asks me to speak with her daughte Maryyan. Informed the daughter that her mother's endometrial biopsy and the ultrasound were both normal. Explained that she needs to lets [place] know if this bleeding continues. Patient's daughter states understanding. [name] RN BSN

## 2015-03-02 LAB — CYTOLOGY - PAP

## 2015-05-20 ENCOUNTER — Encounter: Payer: Self-pay | Admitting: Obstetrics & Gynecology

## 2016-02-06 ENCOUNTER — Ambulatory Visit (INDEPENDENT_AMBULATORY_CARE_PROVIDER_SITE_OTHER): Payer: BLUE CROSS/BLUE SHIELD | Admitting: Obstetrics & Gynecology

## 2016-02-06 ENCOUNTER — Encounter: Payer: Self-pay | Admitting: Obstetrics & Gynecology

## 2016-02-06 VITALS — BP 115/75 | HR 90 | Wt 218.0 lb

## 2016-02-06 DIAGNOSIS — R32 Unspecified urinary incontinence: Secondary | ICD-10-CM

## 2016-02-06 DIAGNOSIS — Z87898 Personal history of other specified conditions: Secondary | ICD-10-CM | POA: Diagnosis not present

## 2016-02-06 DIAGNOSIS — N3281 Overactive bladder: Secondary | ICD-10-CM | POA: Diagnosis not present

## 2016-02-06 DIAGNOSIS — M5440 Lumbago with sciatica, unspecified side: Secondary | ICD-10-CM | POA: Diagnosis not present

## 2016-02-06 LAB — POCT URINALYSIS DIPSTICK
KETONES UA: NEGATIVE
Leukocytes, UA: NEGATIVE
Nitrite, UA: NEGATIVE
Protein, UA: NEGATIVE
Urobilinogen, UA: NEGATIVE

## 2016-02-06 MED ORDER — TOLTERODINE TARTRATE ER 2 MG PO CP24: 2.0000 mg | ORAL_CAPSULE | Freq: Every day | ORAL | 2 refills | Status: DC

## 2016-02-06 NOTE — Progress Notes (Signed)
History:  60 y.o. P3 with 3 SVD.  Presents with c/o low back pain and lower leg pain.  She reports that she has had urinary frequency for several months but, in the past 3 weeks it has become worse.  She related it to her back pain. She denies fever or chills.  She c/o leakage of urine at times but, feels that its mainly because she does not make it to the restroom on time.  She denies dysuria.  No hematuria.  She reports that she has a primary care provider in [place] who she has not seen about the back and leg pain.  She denies trauma.  Her daughter was present and interpreted for pt.   Pt does not drink coffee but, does drink tea 'chai'  The following portions of the patient's history were reviewed and updated as appropriate: allergies, current medications, past family history, past medical history, past social history, past surgical history and problem list.  Review of Systems:  Pertinent items are noted in HPI.  Objective:  Physical Exam Blood pressure 115/75, pulse 90, weight 218 lb (98.9 kg). Gen: NAD Lungs: CTA CV: RRR Back: no tenderness noted.  No CVAT;  Abd: obese; Soft, nontender and nondistended. No suprapubic tenderness Pelvic: Normal appearing external genitalia; normal appearing vaginal mucosa and cervix.  Normal discharge.  Small uterus, no other palpable masses, no uterine or adnexal tenderness. There is a grade I rectocele. No cystocele or uterine prolapse noted.  UA; trace blood; neg: nitrates ne: leuk    Assessment & Plan:  Low back pain- no suggestion of pyelonephritis or UTI on exam Overactive bladder- reviewed with pt sx and effects of meds including dry mouth Leg pain Hematuria  Urine cx Detrol 2mg  po q day F/u in 6 weeks F/u with primary to eval leg pain and back pain NSAIDS prn for back pain rec decrease in caffeine intake  Satish Hammers L. Harraway-Smith, M.D., [name]

## 2016-02-07 ENCOUNTER — Encounter: Payer: Self-pay | Admitting: Obstetrics & Gynecology

## 2016-02-08 LAB — CULTURE, URINE COMPREHENSIVE: Organism ID, Bacteria: NO GROWTH

## 2016-03-08 ENCOUNTER — Ambulatory Visit (INDEPENDENT_AMBULATORY_CARE_PROVIDER_SITE_OTHER): Payer: BLUE CROSS/BLUE SHIELD | Admitting: Obstetrics & Gynecology

## 2016-03-08 ENCOUNTER — Encounter: Payer: Self-pay | Admitting: Obstetrics & Gynecology

## 2016-03-08 VITALS — BP 131/72 | HR 86 | Resp 16 | Ht 60.0 in | Wt 230.0 lb

## 2016-03-08 DIAGNOSIS — N3281 Overactive bladder: Secondary | ICD-10-CM | POA: Diagnosis not present

## 2016-03-08 DIAGNOSIS — R350 Frequency of micturition: Secondary | ICD-10-CM | POA: Diagnosis not present

## 2016-03-08 LAB — POCT URINALYSIS DIPSTICK
Bilirubin, UA: NEGATIVE
Blood, UA: NEGATIVE
Glucose, UA: NEGATIVE
KETONES UA: NEGATIVE
LEUKOCYTES UA: NEGATIVE
NITRITE UA: NEGATIVE
PH UA: 6.5
Spec Grav, UA: 1.02
Urobilinogen, UA: NEGATIVE

## 2016-03-08 MED ORDER — TOLTERODINE TARTRATE ER 2 MG PO CP24: 4.0000 mg | ORAL_CAPSULE | Freq: Every day | ORAL | 2 refills | Status: DC

## 2016-03-08 NOTE — Progress Notes (Signed)
History:  60 y.o. G3P3 here today for f/u of urinary issues.  Pt reports that the pain has resolved but, the frequency remains. She thought all of the sx would resolved immediately.  She reports that she is drinking less chai and drinks less at night.  She denies leakage of urine.  She has been taking Detrol 2mg  XL. She denies any change in mouth dryness.  She does report dry mouth at night due to snoring but noted this PRIOR to starting the medication.  The following portions of the patient's history were reviewed and updated as appropriate: allergies, current medications, past family history, past medical history, past social history, past surgical history and problem list.  Review of Systems:  Pertinent items are noted in HPI.   Objective:  Physical Exam Blood pressure 131/72, pulse 86, resp. rate 16, height 5' (1.524 m), weight 230 lb (104.3 kg). BP 131/72   Pulse 86   Resp 16   Ht 5' (1.524 m)   Wt 230 lb (104.3 kg)   BMI 44.92 kg/m  CONSTITUTIONAL: Well-developed, well-nourished female in no acute distress.  HENT:  Normocephalic, atraumatic EYES: Conjunctivae and EOM are normal. No scleral icterus.  NECK: Normal range of motion SKIN: Skin is warm and dry. No rash noted. Not diaphoretic.No pallor. [place]: Alert and oriented to person, place, and time. Normal coordination.  Labs and Imaging Urine cx and UA neg on last visit  Assessment & Plan:  Urinary freq- improved from last visit but, still a problems.  Will increase the Detrol from 2mg  to 4mg  XL daily.  Reviewed with pt habits that increase nocturia  Rec f/u in 6 weeks.  Dysuria- resolved.  Total face-to-face time with patient was 15 min.  Greater than 50% was spent in counseling and coordination of care with the patient.  Namiah Dunnavant L. Harraway-Smith, M.D., [name]

## 2016-07-16 ENCOUNTER — Ambulatory Visit (INDEPENDENT_AMBULATORY_CARE_PROVIDER_SITE_OTHER): Payer: BLUE CROSS/BLUE SHIELD | Admitting: Obstetrics & Gynecology

## 2016-07-16 ENCOUNTER — Encounter: Payer: Self-pay | Admitting: Obstetrics & Gynecology

## 2016-07-16 DIAGNOSIS — N3281 Overactive bladder: Secondary | ICD-10-CM | POA: Diagnosis not present

## 2016-07-16 MED ORDER — TOLTERODINE TARTRATE ER 2 MG PO CP24: 2.0000 mg | ORAL_CAPSULE | Freq: Every day | ORAL | 12 refills | Status: AC

## 2016-07-16 NOTE — Progress Notes (Signed)
Getting up every night 3 times to urinate. Denies any leaking of urine. Stopped taking the Detrol aboutt fifty days ago. Asked patient if Detrol helped her urinary frequency and she states yes.  [name] RNBSN

## 2016-07-16 NOTE — Patient Instructions (Signed)

## 2016-07-16 NOTE — Progress Notes (Signed)
History:  61 y.o. G3P3 here today for urinary frequency.   Pt was on Detrol with relief of her sx but, her meds 'ran out' and she could not get more from the pharmacy.  She did not know that she could call our ofc.  She denies any new sx but, reports that her sx are not as bad as they were prev.     The following portions of the patient's history were reviewed and updated as appropriate: allergies, current medications, past family history, past medical history, past social history, past surgical history and problem list.  Review of Systems:  Pertinent items are noted in HPI.   Objective:  Physical Exam Blood pressure 125/85, pulse 95, weight 215 lb (97.5 kg). BP 125/85 (BP Location: Left Arm)   Pulse 95   Wt 215 lb (97.5 kg)   BMI 41.99 kg/m  CONSTITUTIONAL: Well-developed, well-nourished female in no acute distress.  HENT:  Normocephalic, atraumatic EYES: Conjunctivae and EOM are normal. No scleral icterus.  NECK: Normal range of motion SKIN: Skin is warm and dry. No rash noted. Not diaphoretic.No pallor. Burden: Alert and oriented to person, place, and time. Normal coordination.  Abd: Soft, nontender and nondistended' obese Pelvic: deferred  Labs and Imaging UA: WNL  Assessment & Plan:  Urinary freq- overactive bladder.  Prev tx'd with Detrol  Sx improved with Detrol 2mg  XL and 4mg  XL. She wants to cont on the 2mg .  She deos not want to f/u in 3 months for a recheck.    Detrol XL 2mg  1 po q day f/u in 1 year or sooner prn  Total face-to-face time with patient was 20 min.  Greater than 50% was spent in counseling and coordination of care with the patient. her daughter interpreted for her.  Crystian Frith L. Harraway-Smith, M.D., [name]

## 2016-09-09 IMAGING — US US TRANSVAGINAL NON-OB
1 series · 14 of 25 positions shown · non-contrast
Comparison: None

CLINICAL DATA: Postmenopausal bleeding x5 days

EXAM:
TRANSABDOMINAL AND TRANSVAGINAL ULTRASOUND OF PELVIS
TECHNIQUE: Both transabdominal and transvaginal ultrasound examinations of the
pelvis were performed. Transabdominal technique was performed for
global imaging of the pelvis including uterus, ovaries, adnexal
regions, and pelvic cul-de-sac. It was necessary to proceed with
endovaginal exam following the transabdominal exam to visualize the
endometrium and left adnexa.

[Series 1: us transvaginal non-ob · 0.25mm/px · 14 of 46 slices shown]
[im 1/46]
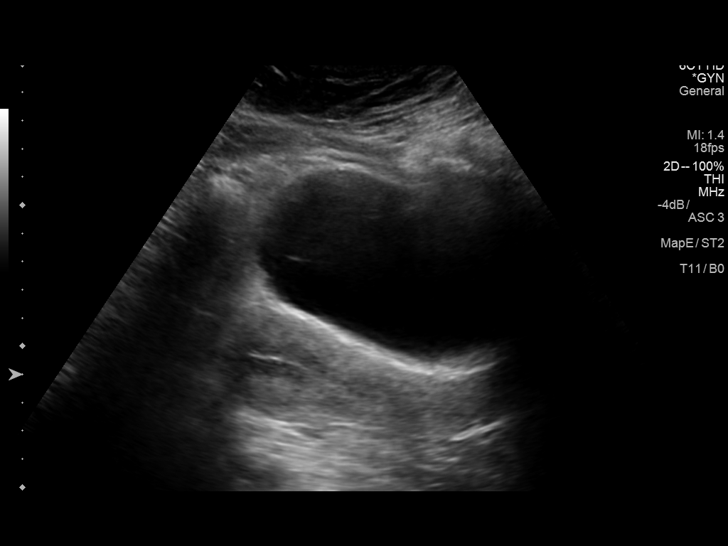
[im 4/46]
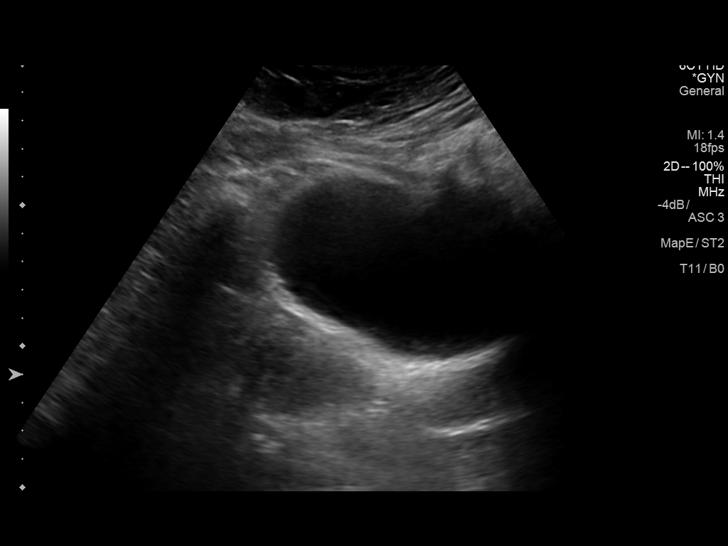
[im 8/46]
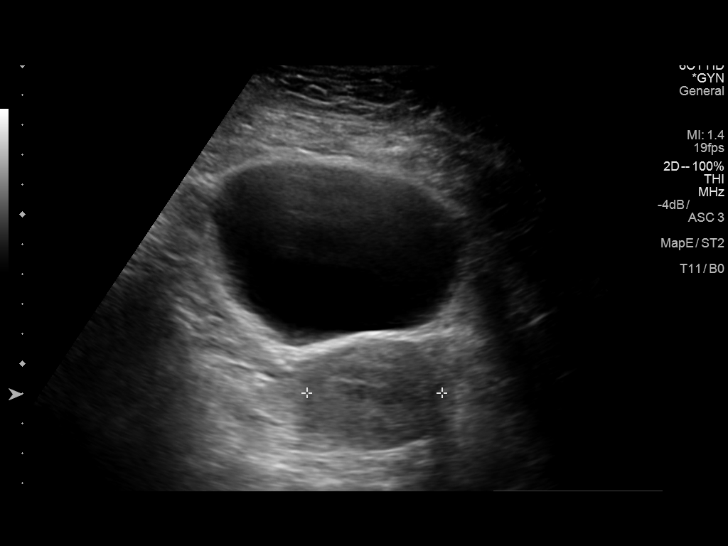
[im 12/46]
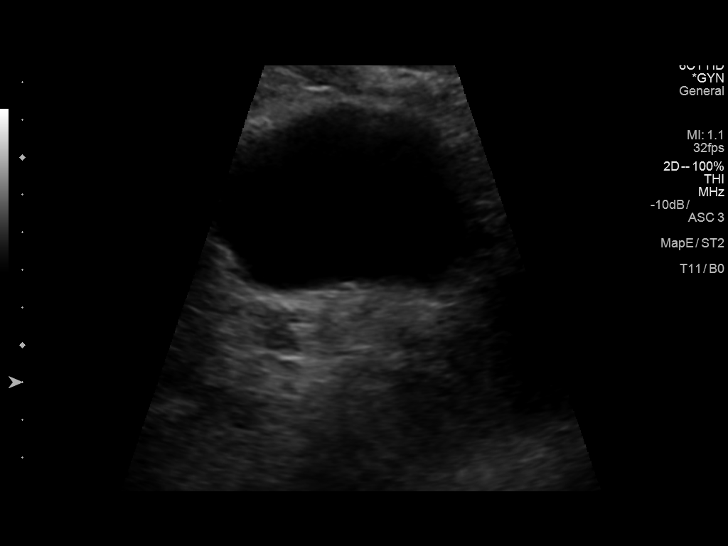
[im 16/46]
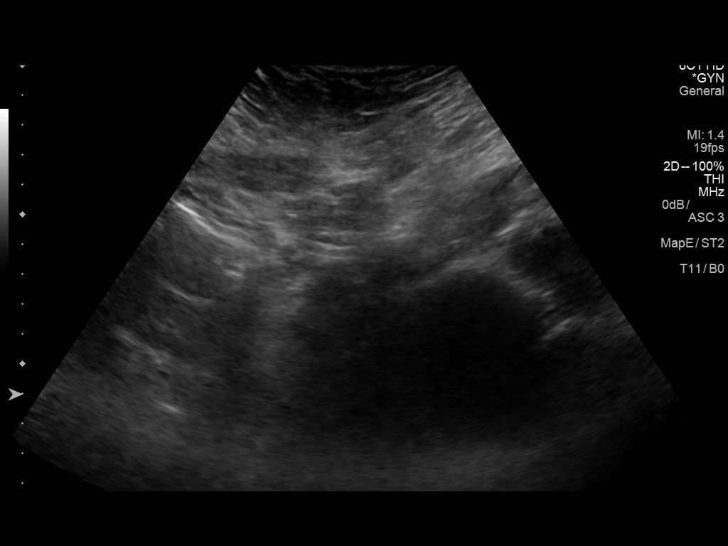
[im 17/46]
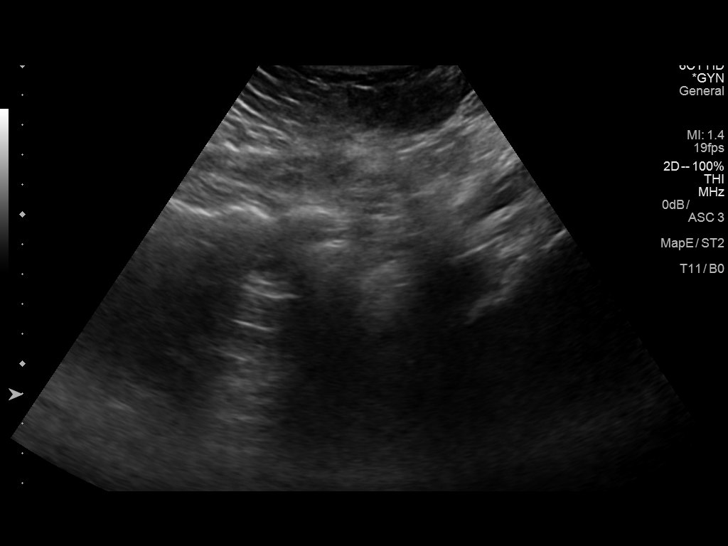
[im 21/46]
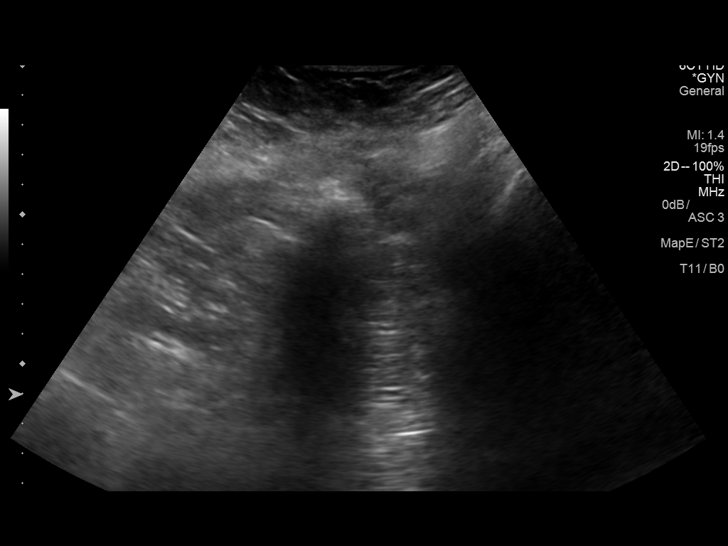
[im 25/46]
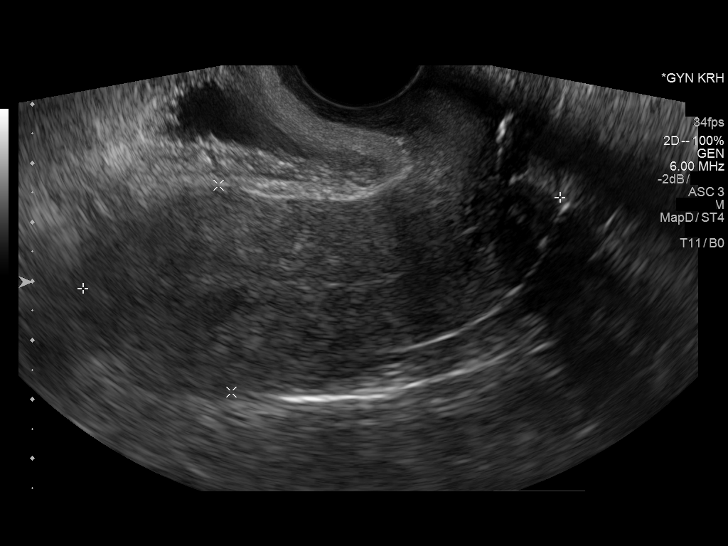
[im 29/46]
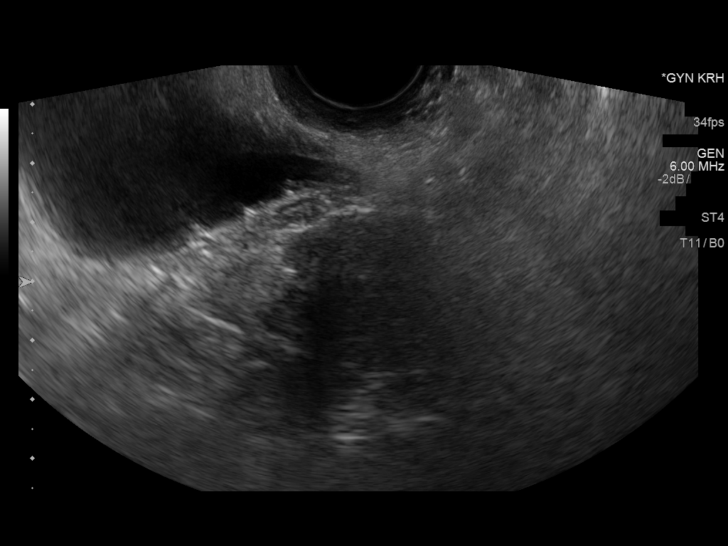
[im 31/46]
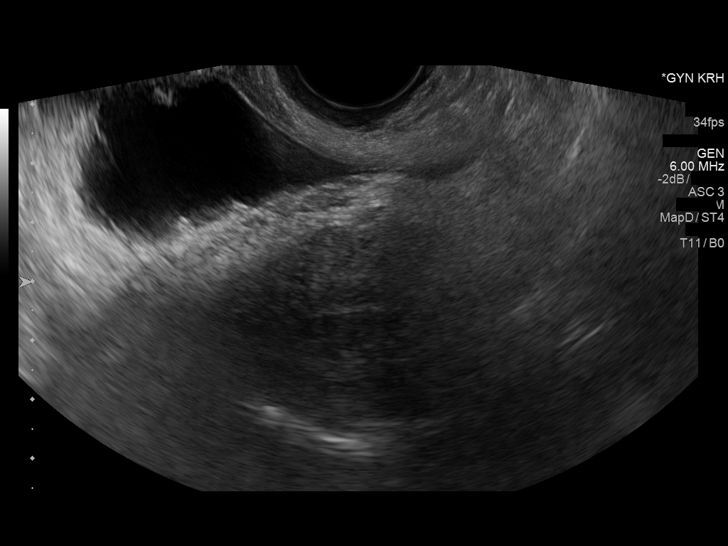
[im 34/46]
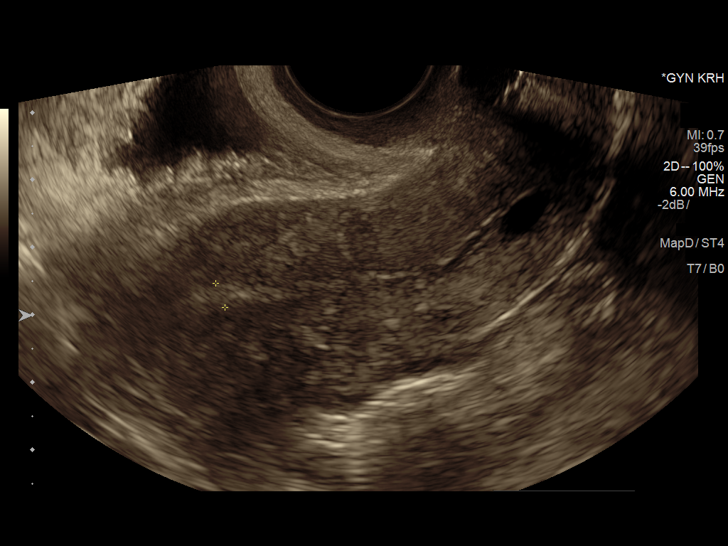
[im 38/46]
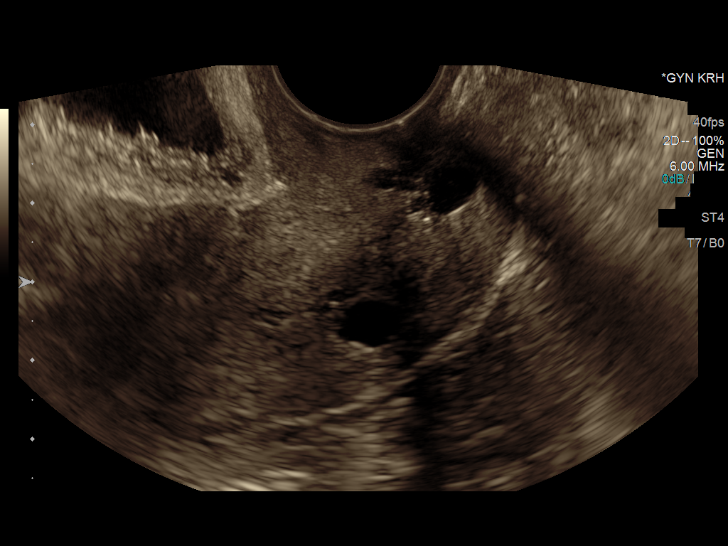
[im 42/46]
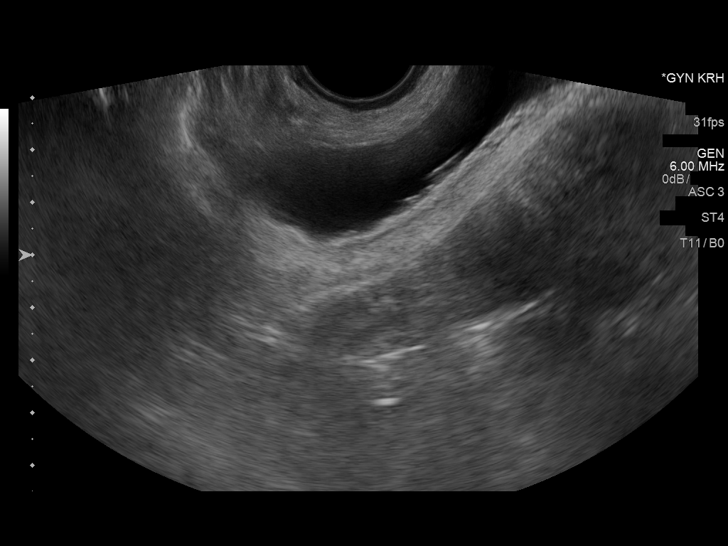
[im 46/46]
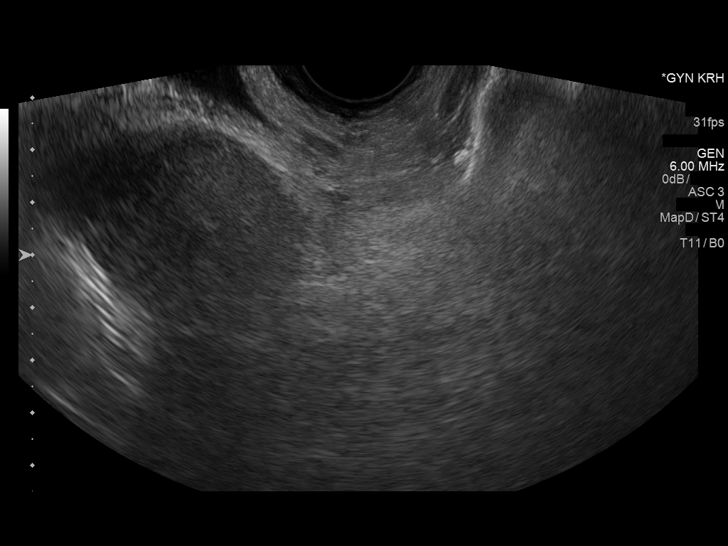

[14 of 25 positions shown; findings below may reference images not displayed]

FINDINGS: Uterus

Measurements: 9.1 x 3.7 x 4.5 cm. No fibroids or other mass
visualized.

Endometrium

Thickness: 4 mm.  No focal abnormality visualized.

Right ovary

Measurements: 2.4 x 1.5 x 1.8 cm. Normal appearance/no adnexal mass.

Left ovary

Not discretely visualized.

Other findings

No free fluid.
IMPRESSION: Endometrial complex measures 4 mm, within normal limits.

Left ovary is not discretely visualized.

## 2017-05-22 ENCOUNTER — Telehealth: Payer: Self-pay | Admitting: *Deleted

## 2017-05-22 NOTE — Telephone Encounter (Signed)
Placed call to pt. Not home, left message with female to have pt call office re:Rx PA. Female states that pt now has Medicaid, no longer has [place]. Advised that our office needs Medicaid number in order to process PA.  Ask to have pt contact office and make office aware of Medicaid info.

## 2017-07-17 ENCOUNTER — Ambulatory Visit (INDEPENDENT_AMBULATORY_CARE_PROVIDER_SITE_OTHER): Payer: Medicaid Other | Admitting: Obstetrics & Gynecology

## 2017-07-17 VITALS — BP 112/89 | HR 102 | Wt 216.0 lb

## 2017-07-17 DIAGNOSIS — Z1231 Encounter for screening mammogram for malignant neoplasm of breast: Secondary | ICD-10-CM

## 2017-07-17 DIAGNOSIS — N950 Postmenopausal bleeding: Secondary | ICD-10-CM

## 2017-07-17 NOTE — Patient Instructions (Signed)

## 2017-07-19 ENCOUNTER — Encounter: Payer: Self-pay | Admitting: Obstetrics & Gynecology

## 2017-07-19 NOTE — Progress Notes (Signed)
GYNECOLOGY OFFICE VISIT NOTE  History:  62 y.o. G3P3 here today for evaluation of second episode of postmenopausal bleeding.  Patient was evaluated in 2016 for the first episode; had negative ultrasound showing a EM of 4 mm and benign biopsy. No bleeding until two weeks ago when she had some spotting for 2 days. No associated symptoms.  No current bleeding. Accompanied by daughter who helps with translation for her as she speaks Arabic and declined medical interpreter. She denies any abnormal vaginal discharge, bleeding, pelvic pain or other concerns.   Past Medical History:  Diagnosis Date  . Diabetes mellitus without complication ([place])   . Hypertension     Past Surgical History:  Procedure Laterality Date  . TENDON REPAIR Right     The following portions of the patient's history were reviewed and updated as appropriate: allergies, current medications, past family history, past medical history, past social history, past surgical history and problem list.   Health Maintenance:  Normal pap and negative HRHPV on 02/28/2015.  Normal mammogram on 03/01/2015.   Review of Systems:  Pertinent items noted in HPI and remainder of comprehensive ROS otherwise negative.  Objective:  Physical Exam BP 112/89   Pulse (!) 102   Wt 216 lb (98 kg)   BMI 42.18 kg/m  CONSTITUTIONAL: Well-developed, well-nourished female in no acute distress.  HENT:  Normocephalic, atraumatic. External right and left ear normal. Oropharynx is clear and moist EYES: Conjunctivae and EOM are normal. Pupils are equal, round, and reactive to light. No scleral icterus.  NECK: Normal range of motion, supple, no masses SKIN: Skin is warm and dry. No rash noted. Not diaphoretic. No erythema. No pallor. NEUROLOGIC: Alert and oriented to person, place, and time. Normal reflexes, muscle tone coordination. No cranial nerve deficit noted. PSYCHIATRIC: Normal mood and affect. Normal behavior. Normal judgment and thought  content. CARDIOVASCULAR: Normal heart rate noted RESPIRATORY: Effort and breath sounds normal, no problems with respiration noted ABDOMEN: Soft, no distention noted.   PELVIC: Deferred MUSCULOSKELETAL: Normal range of motion. No edema noted.  Labs and Imaging No results found.  Assessment & Plan:  1. Postmenopausal bleeding Discussed etiologies of postmenopausal bleeding, concern about precancerous/hyperplasia or cancerous etiology (5 to 10% percent of cases). Also discussed role of unopposed estrogen exposure in leading to thickened or proliferative endometrium; and its possible correlation with endometrial hyperplasia/carcinoma.  Discussed that obesity is linked to endometrial pathology given that adipose cells produce extra estrogen (estrone) which can cause the endometrium to have a significant amount of estrogen exposure.  However, she was reassured that endometrial atrophy and endometrial polyps are the most common causes of postmenopausal bleeding.  Uterine bleeding in postmenopausal women is usually light and self-limited. Exclusion of cancer is the main objective; therefore, treatment is usually unnecessary once cancer has been excluded.  The primary goal in the diagnostic evaluation of postmenopausal women with uterine bleeding is to exclude malignancy; this can include endometrial biopsy and pelvic ultrasound.   Further diagnostic evaluation is indicated for recurrent or persistent bleeding Patient declines endometrial biopsy today; wants to be premedicated before it because it really hurt last time.  Will also need pap smear at that time. Pelvic ultrasound ordered.  - US PELVIC COMPLETE WITH TRANSVAGINAL; Future  Routine preventative health maintenance measures emphasized; will need mammogram scheduled ASAP. Order placed.  Please refer to After Visit Summary for other counseling recommendations.   Return in about 1 week (around 07/24/2017) for Pap and endometrial biopsy (schedule after  ultrasound).   Total face-to-face time with patient: 25 minutes.  Over 50% of encounter was spent on counseling and coordination of care.   Verita Schneiders, MD, [place] for [employer] [facility], [place]

## 2017-07-20 ENCOUNTER — Ambulatory Visit (HOSPITAL_BASED_OUTPATIENT_CLINIC_OR_DEPARTMENT_OTHER): Payer: Medicaid Other

## 2017-07-24 ENCOUNTER — Ambulatory Visit (HOSPITAL_BASED_OUTPATIENT_CLINIC_OR_DEPARTMENT_OTHER)
Admission: RE | Admit: 2017-07-24 | Discharge: 2017-07-24 | Disposition: A | Discharge status: Home / Self Care | Payer: Medicaid Other | Source: Ambulatory Visit | Attending: Obstetrics & Gynecology | Admitting: Obstetrics & Gynecology

## 2017-07-24 DIAGNOSIS — Z1231 Encounter for screening mammogram for malignant neoplasm of breast: Secondary | ICD-10-CM | POA: Insufficient documentation

## 2017-07-24 DIAGNOSIS — D252 Subserosal leiomyoma of uterus: Secondary | ICD-10-CM | POA: Diagnosis not present

## 2017-07-24 DIAGNOSIS — N950 Postmenopausal bleeding: Secondary | ICD-10-CM | POA: Insufficient documentation

## 2017-07-25 ENCOUNTER — Telehealth: Payer: Self-pay

## 2017-07-25 MED ORDER — MISOPROSTOL 200 MCG PO TABS: 200.0000 ug | ORAL_TABLET | Freq: Four times a day (QID) | ORAL | 0 refills | Status: DC

## 2017-07-25 NOTE — Telephone Encounter (Signed)
Patient made aware of results of ultrasound and that there is still aneed for endometrial biopsy. Per Dr. Harolyn Rutherford patient needs to have cytotec before biopsy. [name] RNBSN

## 2017-07-29 ENCOUNTER — Ambulatory Visit (INDEPENDENT_AMBULATORY_CARE_PROVIDER_SITE_OTHER): Payer: Medicaid Other | Admitting: Obstetrics & Gynecology

## 2017-07-29 ENCOUNTER — Other Ambulatory Visit (HOSPITAL_COMMUNITY)
Admission: RE | Admit: 2017-07-29 | Discharge: 2017-07-29 | Disposition: A | Discharge status: Home / Self Care | Payer: Medicaid Other | Source: Ambulatory Visit | Attending: Obstetrics & Gynecology | Admitting: Obstetrics & Gynecology

## 2017-07-29 ENCOUNTER — Encounter: Payer: Self-pay | Admitting: Obstetrics & Gynecology

## 2017-07-29 VITALS — BP 150/99 | HR 84 | Resp 16 | Ht 64.0 in | Wt 217.0 lb

## 2017-07-29 DIAGNOSIS — Z01419 Encounter for gynecological examination (general) (routine) without abnormal findings: Secondary | ICD-10-CM | POA: Insufficient documentation

## 2017-07-29 DIAGNOSIS — R87610 Atypical squamous cells of undetermined significance on cytologic smear of cervix (ASC-US): Secondary | ICD-10-CM | POA: Insufficient documentation

## 2017-07-29 DIAGNOSIS — Z0000 Encounter for general adult medical examination without abnormal findings: Secondary | ICD-10-CM | POA: Diagnosis not present

## 2017-07-29 DIAGNOSIS — N950 Postmenopausal bleeding: Secondary | ICD-10-CM

## 2017-07-29 NOTE — Progress Notes (Signed)
Subjective:     Brenda Adkins is a 62 y.o. female here for a routine exam.  Current complaints: PMPB.     Gynecologic History No LMP recorded. Patient is postmenopausal. Contraception: post menopausal status Last Pap: 02/28/2015. Results were: normal Last mammogram: 07/24/2017. Results were: normal  Obstetric History OB History  Gravida Para Term Preterm AB Living  3 3       3   SAB TAB Ectopic Multiple Live Births               # Outcome Date GA Lbr Len/2nd Weight Sex Delivery Anes PTL Lv  3 Para      Vag-Spont     2 Para      Vag-Spont     1 Para      Vag-Spont        The following portions of the patient's history were reviewed and updated as appropriate: allergies, current medications, past family history, past medical history, past social history, past surgical history and problem list.  Review of Systems Pertinent items are noted in HPI.    Objective:  BP (!) 150/99   Pulse 84   Resp 16   Ht 5\' 4"  (1.626 m)   Wt 217 lb (98.4 kg)   BMI 37.25 kg/m   General Appearance:    Alert, cooperative, no distress, appears stated age  Head:    Normocephalic, without obvious abnormality, atraumatic  Eyes:    conjunctiva/corneas clear, EOM's intact, both eyes  Ears:    Normal external ear canals, both ears  Nose:   Nares normal, septum midline, mucosa normal, no drainage    or sinus tenderness  Throat:   Lips, mucosa, and tongue normal; teeth and gums normal  Neck:   Supple, symmetrical, trachea midline, no adenopathy;    thyroid:  no enlargement/tenderness/nodules  Back:     Symmetric, no curvature, ROM normal, no CVA tenderness  Lungs:     Clear to auscultation bilaterally, respirations unlabored  Chest Wall:    No tenderness or deformity   Heart:    Regular rate and rhythm, S1 and S2 normal, no murmur, rub   or gallop  Breast Exam:    No done  Abdomen:     Soft, non-tender, bowel sounds active all four quadrants,    no masses, no organomegaly  Genitalia:    Normal  female without lesion, discharge or tenderness     Extremities:   Extremities normal, atraumatic, no cyanosis or edema  Pulses:   2+ and symmetric all extremities  Skin:   Skin color, texture, turgor normal, no rashes or lesions  .  The indications for endometrial biopsy were reviewed.   Risks of the biopsy including cramping, bleeding, infection, uterine perforation, inadequate specimen and need for additional procedures  were discussed. The patient states she understands and agrees to undergo procedure today. Consent was signed. Time out was performed. Urine HCG was negative. A sterile speculum was placed in the patient's vagina and the cervix was prepped with Betadine. A single-toothed tenaculum was placed on the anterior lip of the cervix to stabilize it. The 3 mm pipelle was introduced into the endometrial cavity without difficulty to a depth of 9cm, and a moderate amount of tissue was obtained and sent to pathology. The instruments were removed from the patient's vagina. Minimal bleeding from the cervix was noted. The patient tolerated the procedure well.   Assessment:    Healthy female exam.   PMPB- s/p endo bx today.  Minimal tissue extracted     Plan:    Follow up in: 4 weeks.For results of bx    Language barrier- daughter interpret for pt. They have declined the phone interpreter.  Routine post-procedure instructions were given to the patient. The patient will follow up to review the results and for further management.   F/u PAP If pt is WNL no further PAP tests are indicated  Brenda Adkins, M.D., [name]

## 2017-07-29 NOTE — Patient Instructions (Signed)

## 2017-07-31 LAB — CYTOLOGY - PAP
DIAGNOSIS: UNDETERMINED — AB
HPV (WINDOPATH): NOT DETECTED

## 2017-09-16 ENCOUNTER — Ambulatory Visit: Payer: Medicaid Other | Admitting: Obstetrics & Gynecology

## 2017-10-11 ENCOUNTER — Encounter: Payer: Self-pay | Admitting: Obstetrics and Gynecology

## 2017-10-11 ENCOUNTER — Ambulatory Visit (INDEPENDENT_AMBULATORY_CARE_PROVIDER_SITE_OTHER): Payer: Medicaid Other | Admitting: Obstetrics and Gynecology

## 2017-10-11 VITALS — BP 118/89 | HR 95 | Ht 62.0 in | Wt 216.8 lb

## 2017-10-11 DIAGNOSIS — R87619 Unspecified abnormal cytological findings in specimens from cervix uteri: Secondary | ICD-10-CM | POA: Diagnosis not present

## 2017-10-11 DIAGNOSIS — N950 Postmenopausal bleeding: Secondary | ICD-10-CM | POA: Diagnosis not present

## 2017-10-11 NOTE — Progress Notes (Signed)
   GYNECOLOGY OFFICE FOLLOW UP NOTE  History:  62 y.o. G3P3 here today for follow up for post menopausal bleeding, has had no further episodes.    Past Medical History:  Diagnosis Date  . Diabetes mellitus without complication ([place])   . Hypertension     Past Surgical History:  Procedure Laterality Date  . TENDON REPAIR Right     Current Outpatient Medications:  .  celecoxib (CELEBREX) 200 MG capsule, Take 200 mg by mouth., Disp: , Rfl:  .  levothyroxine (SYNTHROID, LEVOTHROID) 50 MCG tablet, Take 50 mcg by mouth., Disp: , Rfl:  .  losartan-hydrochlorothiazide (HYZAAR) 100-12.5 MG per tablet, Take 1 tablet by mouth daily. , Disp: , Rfl:  .  metFORMIN (GLUCOPHAGE-XR) 500 MG 24 hr tablet, Take 500 mg by mouth., Disp: , Rfl:  .  tolterodine (DETROL LA) 2 MG 24 hr capsule, Take 1 capsule (2 mg total) by mouth daily., Disp: 30 capsule, Rfl: 12 .  albuterol (PROVENTIL HFA) 108 (90 BASE) MCG/ACT inhaler, Inhale 2 puffs into the lungs., Disp: , Rfl:   The following portions of the patient's history were reviewed and updated as appropriate: allergies, current medications, past family history, past medical history, past social history, past surgical history and problem list.   Review of Systems:  Pertinent items noted in HPI and remainder of comprehensive ROS otherwise negative.   Objective:  Physical Exam BP 118/89   Pulse 95   Ht 5\' 2"  (1.575 m)   Wt 216 lb 12.8 oz (98.3 kg)   BMI 39.65 kg/m  CONSTITUTIONAL: Well-developed, well-nourished female in no acute distress.  HENT:  Normocephalic, atraumatic. External right and left ear normal. Oropharynx is clear and moist EYES: Conjunctivae and EOM are normal. Pupils are equal, round, and reactive to light. No scleral icterus.  NECK: Normal range of motion, supple, no masses SKIN: Skin is warm and dry. No rash noted. Not diaphoretic. No erythema. No pallor. NEUROLOGIC: Alert and oriented to person, place, and time. Normal reflexes, muscle  tone coordination. No cranial nerve deficit noted. PSYCHIATRIC: Normal mood and affect. Normal behavior. Normal judgment and thought content. MUSCULOSKELETAL: Normal range of motion. No edema noted.  Labs and Imaging Diagnosis Endometrium, biopsy - FRAGMENTS OF BENIGN ENDOMETRIAL AND ENDOCERVICAL TYPE GLANDULAR EPITHELIUM. NO ATYPIA, DYSPLASIA, HYPERPLASIA, OR MALIGNANCY IDENTIFIED. Mark Martinique MD Pathologist, Electronic Signature (Case signed 07/31/2017) Spe  Adequacy Reason Satisfactory for evaluation, endocervical/transformation zone component PRESENT. Diagnosis - ATYPICAL SQUAMOUS CELLS OF UNDETERMINED SIGNIFICANCE (ASC-US) Thressa Sheller MD Pathologist, Electronic Signature (Case signed 07/31/2017) Specimen Clinical Information PMB Source CervicoVaginal Pap [ThinPrep Imaged] Molecular Results Test Result HPV High Risk NOT DETECTED Disclaimer  Assessment & Plan:   1. Post-menopausal bleeding Reviewed benign path for EMB No further treatment needed  2. Abnormal pap, ASCUS Reviewed ASCUS with negative high risk HPV on pap and need for repeat pap in 3 years   Patient's daughter served as Optometrist per patient request. Declined phone interpretor  Routine preventative health maintenance measures emphasized. Please refer to After Visit Summary for other counseling recommendations.   Return in about 1 year (around 10/12/2018) for annual.   Feliz Beam, M.D. Attending [place], [facility] for [employer] [facility], [place]

## 2018-11-10 ENCOUNTER — Other Ambulatory Visit: Payer: Self-pay

## 2018-11-10 ENCOUNTER — Other Ambulatory Visit (HOSPITAL_COMMUNITY)
Admission: RE | Admit: 2018-11-10 | Discharge: 2018-11-10 | Disposition: A | Discharge status: Home / Self Care | Payer: Medicaid Other | Source: Ambulatory Visit | Attending: Obstetrics & Gynecology | Admitting: Obstetrics & Gynecology

## 2018-11-10 ENCOUNTER — Ambulatory Visit (INDEPENDENT_AMBULATORY_CARE_PROVIDER_SITE_OTHER): Payer: Medicaid Other | Admitting: Obstetrics & Gynecology

## 2018-11-10 ENCOUNTER — Encounter: Payer: Self-pay | Admitting: Obstetrics & Gynecology

## 2018-11-10 VITALS — BP 105/66 | HR 69 | Ht 62.0 in | Wt 212.0 lb

## 2018-11-10 DIAGNOSIS — Z01419 Encounter for gynecological examination (general) (routine) without abnormal findings: Secondary | ICD-10-CM

## 2018-11-10 DIAGNOSIS — R87610 Atypical squamous cells of undetermined significance on cytologic smear of cervix (ASC-US): Secondary | ICD-10-CM | POA: Insufficient documentation

## 2018-11-10 DIAGNOSIS — Z0000 Encounter for general adult medical examination without abnormal findings: Secondary | ICD-10-CM | POA: Diagnosis not present

## 2018-11-10 DIAGNOSIS — Z1239 Encounter for other screening for malignant neoplasm of breast: Secondary | ICD-10-CM

## 2018-11-10 NOTE — Addendum Note (Signed)
Addended by: Wendelyn Breslow L on: 11/10/2018 11:48 AM   Modules accepted: Orders

## 2018-11-10 NOTE — Progress Notes (Signed)
Subjective:     Brenda Adkins is a 63 y.o. female here for a routine exam.  Current complaints: no incontinence currently. No GYN problems or concerns per pt.      Gynecologic History No LMP recorded. Patient is postmenopausal. Contraception: post menopausal status Last Pap: 07/29/2017  Results were: ASCUS neg hrHPV  Last mammogram: 07/24/2017. Results were: normal  Obstetric History OB History  Gravida Para Term Preterm AB Living  3 3       3   SAB TAB Ectopic Multiple Live Births               # Outcome Date GA Lbr Len/2nd Weight Sex Delivery Anes PTL Lv  3 Para      Vag-Spont     2 Para      Vag-Spont     1 Para      Vag-Spont      The following portions of the patient's history were reviewed and updated as appropriate: allergies, current medications, past family history, past medical history, past social history, past surgical history and problem list.  Review of Systems Pertinent items are noted in HPI.    Objective:  BP 105/66   Pulse 69   Ht 5\' 2"  (1.575 m)   Wt 212 lb (96.2 kg)   BMI 38.78 kg/m   General Appearance:    Alert, cooperative, no distress, appears stated age  Head:    Normocephalic, without obvious abnormality, atraumatic  Eyes:    conjunctiva/corneas clear, EOM's intact, both eyes  Ears:    Normal external ear canals, both ears  Nose:   Nares normal, septum midline, mucosa normal, no drainage    or sinus tenderness  Throat:   Lips, mucosa, and tongue normal; teeth and gums normal  Neck:   Supple, symmetrical, trachea midline, no adenopathy;    thyroid:  no enlargement/tenderness/nodules  Back:     Symmetric, no curvature, ROM normal, no CVA tenderness  Lungs:     respirations unlabored  Chest Wall:    No tenderness or deformity   Heart:    Regular rate and rhythm  Breast Exam:    No tenderness, masses, or nipple abnormality  Abdomen:     Soft, non-tender, obese, non distended.     no masses, no organomegaly  Genitalia:    Normal female without  lesion, discharge or tenderness     Extremities:   Extremities normal, atraumatic, no cyanosis or edema  Pulses:   2+ and symmetric all extremities  Skin:   Skin color, texture, turgor normal, no rashes or lesions    Assessment:    Healthy female exam.   Prev ASCUS PAP with neg hrHPV     Plan:   Screening mammogram  F/u PAP with hrHPV F/u in 1 year or sooner prn   Rommel Hogston L. Harraway-Smith, M.D., [name]

## 2018-11-13 LAB — CYTOLOGY - PAP
Diagnosis: UNDETERMINED — AB
HPV: NOT DETECTED

## 2018-11-17 ENCOUNTER — Telehealth: Payer: Self-pay

## 2018-11-17 NOTE — Telephone Encounter (Signed)
-----   Message from Lavonia Drafts, MD sent at 11/17/2018 12:05 PM EDT ----- Please call pt. Her PAP showed abnormal cells but, her hrHPv was neg. She will need a repeat Pap in 1 year.   Thx,  Clh-S

## 2018-11-17 NOTE — Telephone Encounter (Signed)
Patient's daughter (who regularily interprets for the patient) made aware that her mother's pap smear was abnormal but the hrHPV was negative. It is important for her to get a yearly pap smear and ensure she comes next year. [name] RN

## 2018-11-17 NOTE — Telephone Encounter (Signed)
Left message for patient to return call to the office. [name] Rn

## 2018-11-20 ENCOUNTER — Ambulatory Visit (HOSPITAL_BASED_OUTPATIENT_CLINIC_OR_DEPARTMENT_OTHER)
Admission: RE | Admit: 2018-11-20 | Discharge: 2018-11-20 | Disposition: A | Discharge status: Home / Self Care | Payer: Medicaid Other | Source: Ambulatory Visit | Attending: Obstetrics & Gynecology | Admitting: Obstetrics & Gynecology

## 2018-11-20 ENCOUNTER — Other Ambulatory Visit: Payer: Self-pay

## 2018-11-20 ENCOUNTER — Other Ambulatory Visit: Payer: Self-pay | Admitting: Obstetrics & Gynecology

## 2018-11-20 DIAGNOSIS — R928 Other abnormal and inconclusive findings on diagnostic imaging of breast: Secondary | ICD-10-CM | POA: Diagnosis not present

## 2018-11-20 DIAGNOSIS — Z1239 Encounter for other screening for malignant neoplasm of breast: Secondary | ICD-10-CM

## 2018-11-20 DIAGNOSIS — Z01419 Encounter for gynecological examination (general) (routine) without abnormal findings: Secondary | ICD-10-CM

## 2018-11-20 DIAGNOSIS — Z1231 Encounter for screening mammogram for malignant neoplasm of breast: Secondary | ICD-10-CM | POA: Insufficient documentation

## 2018-11-21 ENCOUNTER — Other Ambulatory Visit: Payer: Self-pay | Admitting: Obstetrics & Gynecology

## 2018-11-21 DIAGNOSIS — R928 Other abnormal and inconclusive findings on diagnostic imaging of breast: Secondary | ICD-10-CM

## 2018-11-24 ENCOUNTER — Other Ambulatory Visit: Payer: Self-pay | Admitting: Obstetrics & Gynecology

## 2018-11-24 ENCOUNTER — Telehealth: Payer: Self-pay

## 2018-11-24 NOTE — Telephone Encounter (Signed)
-----   Message from Lavonia Drafts, MD sent at 11/21/2018  9:26 AM EDT ----- Please call pt. She needs additional imaging for her breast cancer screening.  Please order.   Thx,  clh-S

## 2018-11-24 NOTE — Telephone Encounter (Signed)
Patient called and daughter Melton Alar called back for results. Made her aware of need for additional breast imaging and it will be done at the breast center. Patient's daughter given address and phone number. Appointment already made in system. [name] RN

## 2018-11-27 ENCOUNTER — Ambulatory Visit
Admission: RE | Admit: 2018-11-27 | Discharge: 2018-11-27 | Disposition: A | Discharge status: Home / Self Care | Payer: Medicaid Other | Source: Ambulatory Visit | Attending: Obstetrics & Gynecology | Admitting: Obstetrics & Gynecology

## 2018-11-27 ENCOUNTER — Ambulatory Visit: Payer: Medicaid Other

## 2018-11-27 ENCOUNTER — Other Ambulatory Visit: Payer: Self-pay

## 2018-11-27 DIAGNOSIS — R928 Other abnormal and inconclusive findings on diagnostic imaging of breast: Secondary | ICD-10-CM

## 2019-02-02 IMAGING — US US PELVIS COMPLETE TRANSABD/TRANSVAG
1 series · 13 of 25 positions shown · non-contrast
Comparison: 03/01/2015

CLINICAL DATA: Postmenopausal bleeding

EXAM:
TRANSABDOMINAL AND TRANSVAGINAL ULTRASOUND OF PELVIS
TECHNIQUE: Both transabdominal and transvaginal ultrasound examinations of the
pelvis were performed. Transabdominal technique was performed for
global imaging of the pelvis including uterus, ovaries, adnexal
regions, and pelvic cul-de-sac. It was necessary to proceed with
endovaginal exam following the transabdominal exam to visualize the
endometrium and ovaries.

[Series 1: us pelvis complete transabd/transvag · 0.21mm/px · 13 of 91 slices shown]
[im 1/91]
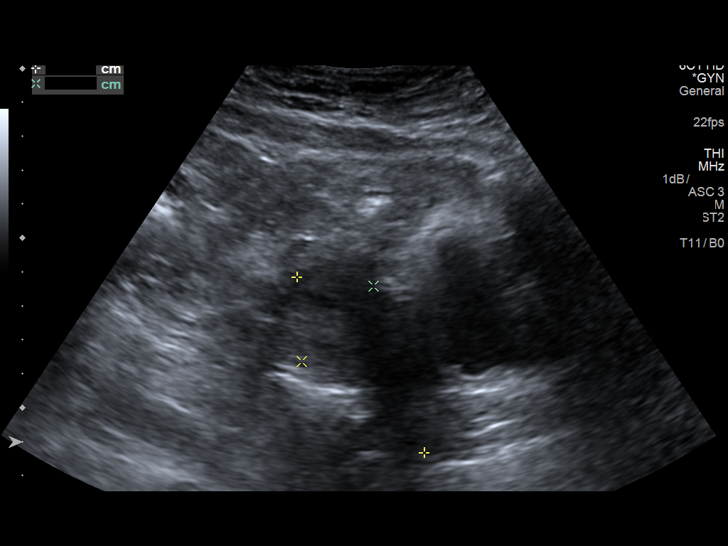
[im 8/91]
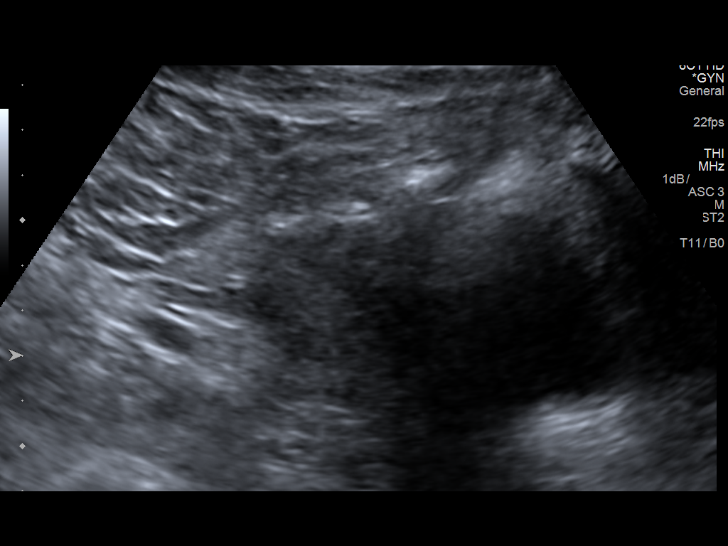
[im 16/91]
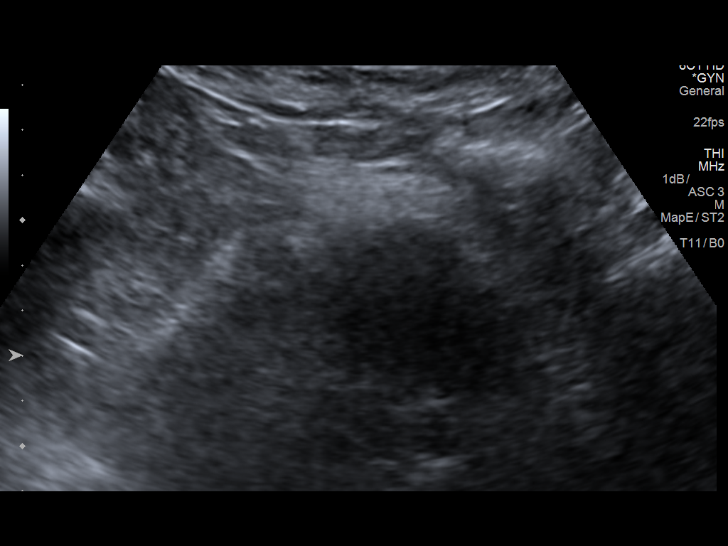
[im 23/91]
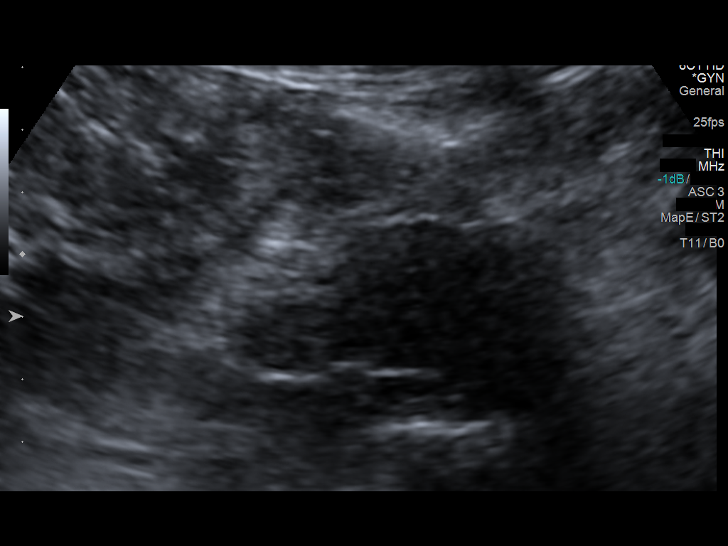
[im 31/91]
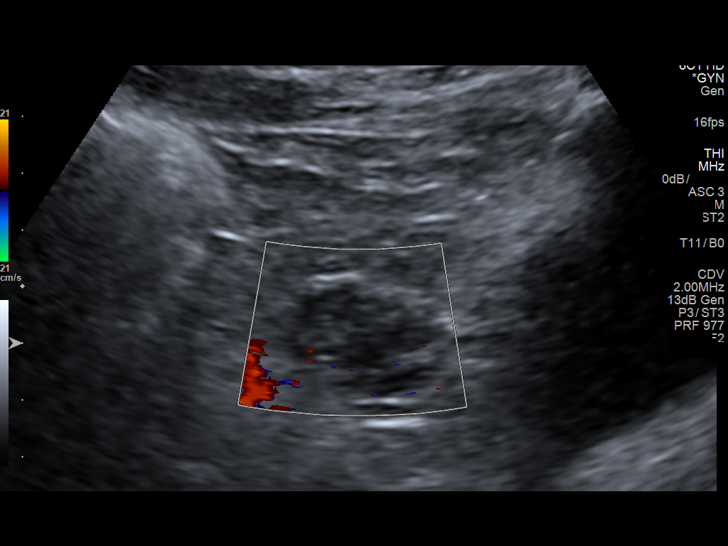
[im 38/91]
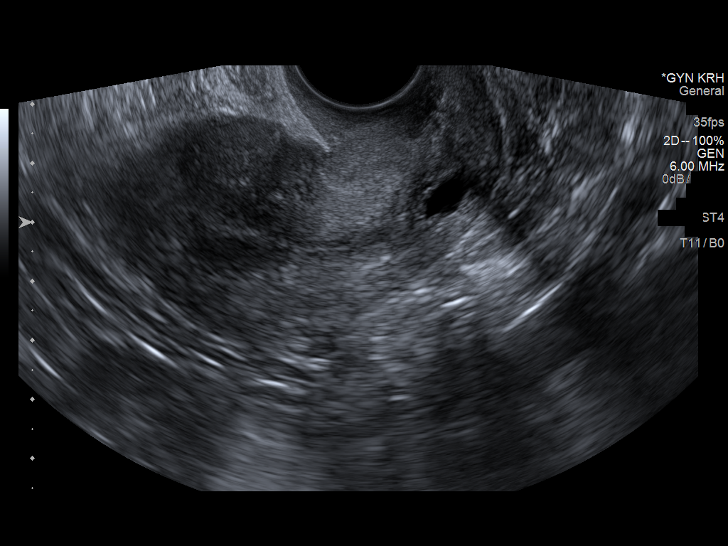
[im 46/91]
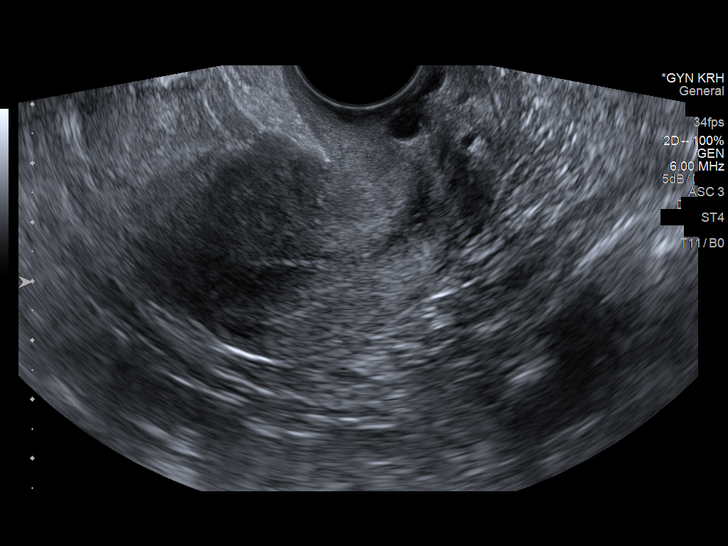
[im 53/91]
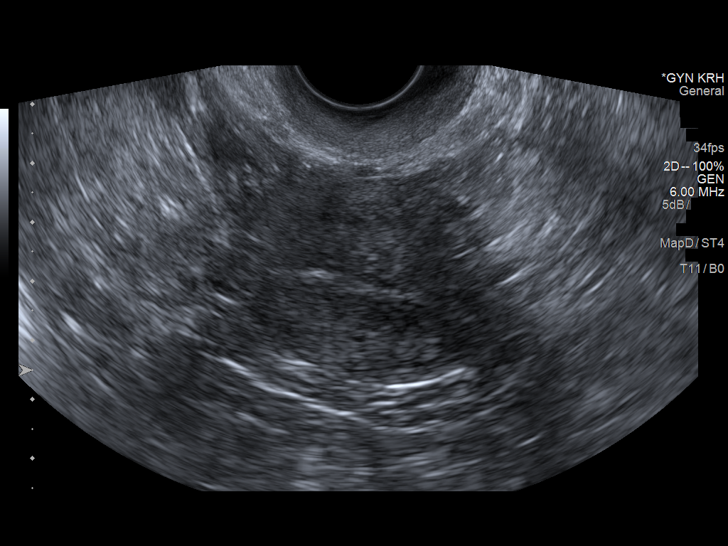
[im 61/91]
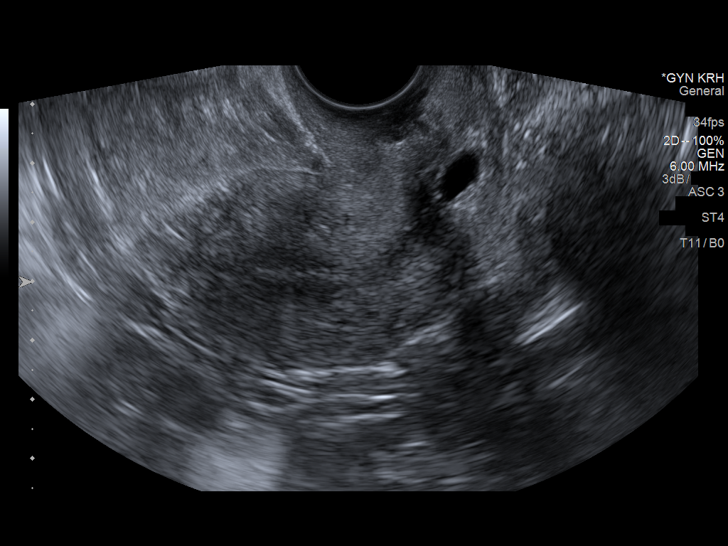
[im 68/91]
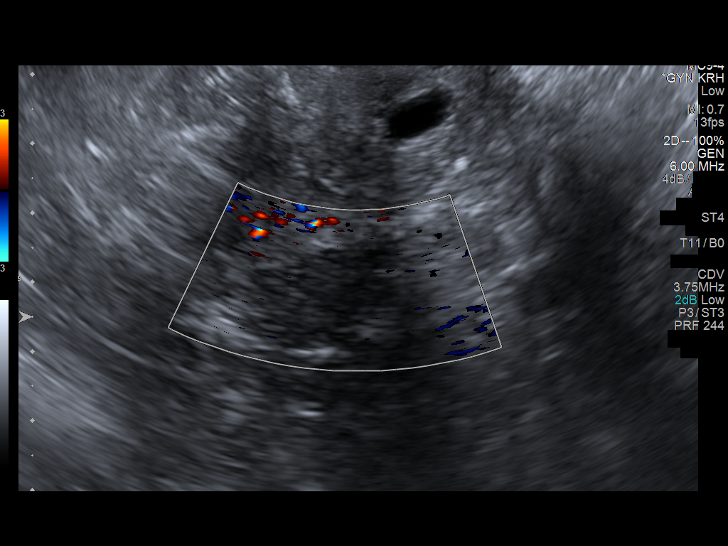
[im 76/91]
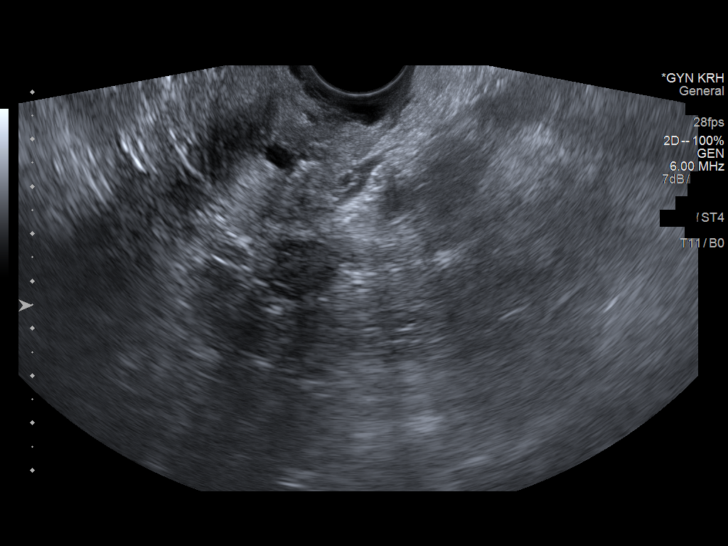
[im 83/91]
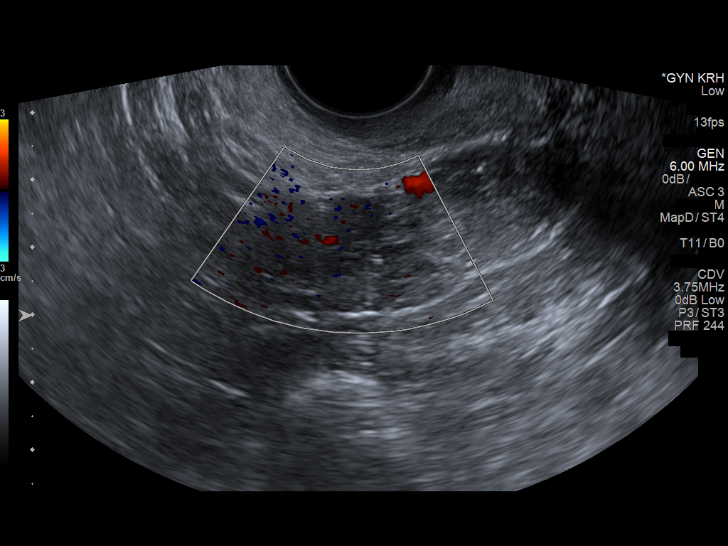
[im 91/91]
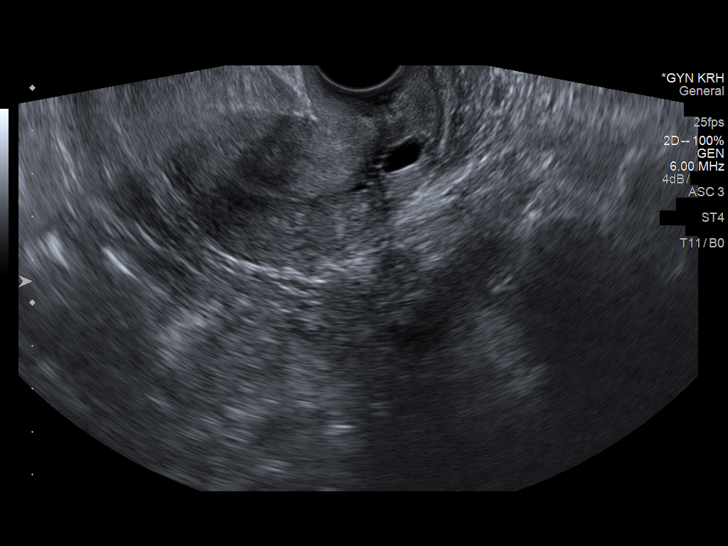

[13 of 25 positions shown; findings below may reference images not displayed]

FINDINGS: Uterus

Measurements: 7.0 x 3.8 x 4.4 cm. Probable small right anterior
lower uterine fibroid measuring up to 1 cm in a subserosal position.

Endometrium

Thickness: 3 mm in thickness.  No focal abnormality visualized.

Right ovary

Measurements: 2.0 x 1.2 x 2.3 cm. Normal appearance/no adnexal mass.

Left ovary

Measurements: 2.4 x 1.2 x 1.8 cm. Normal appearance/no adnexal mass.

Other findings

No abnormal free fluid.
IMPRESSION: Small anterior subserosal fibroid, 1 cm.

Endometrium normal in thickness and appearance. In the setting of
post-menopausal bleeding, this is consistent with a benign etiology
such as endometrial atrophy. If bleeding remains unresponsive to
hormonal or medical therapy, sonohysterogram should be considered
for focal lesion work-up. (Ref: Radiological Reasoning: Algorithmic
Workup of Abnormal Vaginal Bleeding with Endovaginal Sonography and
Sonohysterography. AJR [id number]; 191:S68-73)

## 2019-02-11 ENCOUNTER — Other Ambulatory Visit: Payer: Self-pay

## 2019-02-11 ENCOUNTER — Ambulatory Visit (INDEPENDENT_AMBULATORY_CARE_PROVIDER_SITE_OTHER): Payer: Medicaid Other | Admitting: Obstetrics & Gynecology

## 2019-02-11 ENCOUNTER — Encounter: Payer: Self-pay | Admitting: Obstetrics & Gynecology

## 2019-02-11 VITALS — BP 112/88 | HR 87 | Wt 214.0 lb

## 2019-02-11 DIAGNOSIS — N644 Mastodynia: Secondary | ICD-10-CM

## 2019-02-11 NOTE — Patient Instructions (Signed)

## 2019-02-11 NOTE — Progress Notes (Signed)
History:  63 y.o. G3P3 here today for eval of bilateral breat pain. Pt reports that for the past 2-3 days she has had breast tenderness. She reports that it is on both sides. She denies nipple drainage or bleeding from nipples. She was packing and unpacking winter clothes prior to this occurring.   The following portions of the patient's history were reviewed and updated as appropriate: allergies, current medications, past family history, past medical history, past social history, past surgical history and problem list.  Review of Systems:  Pertinent items are noted in HPI.    Objective:  Physical Exam Blood pressure 112/88, pulse 87, weight 214 lb (97.1 kg).  CONSTITUTIONAL: Well-developed, well-nourished female in no acute distress.  HENT:  Normocephalic, atraumatic EYES: Conjunctivae and EOM are normal. No scleral icterus.  NECK: Normal range of motion SKIN: Skin is warm and dry. No rash noted. Not diaphoretic.No pallor. [place]: Alert and oriented to person, place, and time. Normal coordination.  Breasts: no nipple discharge; the breast are symmetric. There are not skin changes.    Labs and Imaging 11/27/2018 CLINICAL DATA:  Screening recall for a possible left breast architectural distortion.  EXAM: DIGITAL DIAGNOSTIC UNILATERAL LEFT MAMMOGRAM WITH CAD AND TOMO  COMPARISON:  Previous exam(s).  ACR Breast Density Category b: There are scattered areas of fibroglandular density.  FINDINGS: The possible distortion noted in the medial left breast disperses on the diagnostic spot-compression imaging consistent with superimposed fibroglandular tissue. There is no residual true distortion. There are no masses or suspicious calcifications.  Mammographic images were processed with CAD.  IMPRESSION: Negative exam.  No evidence of breast malignancy.  RECOMMENDATION: Screening mammogram in one year.(Code:SM-B-01Y)  I have discussed the findings and recommendations  with the patient. Results were also provided in writing at the conclusion of the visit. If applicable, a reminder letter will be sent to the patient regarding the next appointment.  BI-RADS CATEGORY  1: Negative.   Assessment & Plan:  Breast pain- bilateral with normal exan and normal mammogram in 10/2018. I suspect MSK pain.   rec f/u if pain persists beyond 2 weeks.  F/u sooner pnr  Pt speaks Arabic. Her daughter interprets for her.   Total face-to-face time with patient was 17 min.  Greater than 50% was spent in counseling and coordination of care with the patient.      Aleysia Oltmann L. Harraway-Smith, M.D., [name]

## 2019-11-18 ENCOUNTER — Encounter: Payer: Self-pay | Admitting: Obstetrics & Gynecology

## 2019-11-18 ENCOUNTER — Other Ambulatory Visit (HOSPITAL_COMMUNITY)
Admission: RE | Admit: 2019-11-18 | Discharge: 2019-11-18 | Disposition: A | Discharge status: Home / Self Care | Payer: Medicaid Other | Source: Ambulatory Visit | Attending: Obstetrics & Gynecology | Admitting: Obstetrics & Gynecology

## 2019-11-18 ENCOUNTER — Ambulatory Visit (INDEPENDENT_AMBULATORY_CARE_PROVIDER_SITE_OTHER): Payer: Medicaid Other | Admitting: Obstetrics & Gynecology

## 2019-11-18 ENCOUNTER — Other Ambulatory Visit: Payer: Self-pay

## 2019-11-18 VITALS — BP 127/85 | HR 79 | Ht 60.05 in | Wt 219.0 lb

## 2019-11-18 DIAGNOSIS — R87619 Unspecified abnormal cytological findings in specimens from cervix uteri: Secondary | ICD-10-CM

## 2019-11-18 DIAGNOSIS — Z01419 Encounter for gynecological examination (general) (routine) without abnormal findings: Secondary | ICD-10-CM | POA: Insufficient documentation

## 2019-11-18 NOTE — Progress Notes (Signed)
[employer] language resources Ravinder [phone]. Bryton Romagnoli l Daulton Harbaugh, CMA

## 2019-11-18 NOTE — Progress Notes (Signed)
Subjective:     Brenda Adkins is a 63 y.o. female here for a routine exam.  Current complaints: occ lumps under her arms if she is stressed. These are painful when they are present. There is none currently. Pt had an abnormal PAP last year. She reports that she used to have nocturia with incontinence overnight. That has resolved.     Gynecologic History No LMP recorded. Patient is postmenopausal. Contraception: post menopausal status Last Pap: 11/10/2018. Results were: abnormal ASCUS neg hrHPV Last mammogram: 11/27/2018. Results were: normal  Obstetric History OB History  Gravida Para Term Preterm AB Living  3 3       3   SAB TAB Ectopic Multiple Live Births               # Outcome Date GA Lbr Len/2nd Weight Sex Delivery Anes PTL Lv  3 Para      Vag-Spont     2 Para      Vag-Spont     1 Para      Vag-Spont        The following portions of the patient's history were reviewed and updated as appropriate: allergies, current medications, past family history, past medical history, past social history, past surgical history and problem list.  Review of Systems Pertinent items are noted in HPI.    Objective:  BP 127/85   Pulse 79   Ht 5' 0.05" (1.525 m)   Wt 219 lb (99.3 kg)   BMI 42.70 kg/m  General Appearance:    Alert, cooperative, no distress, appears stated age  Head:    Normocephalic, without obvious abnormality, atraumatic  Eyes:    conjunctiva/corneas clear, EOM's intact, both eyes  Ears:    Normal external ear canals, both ears  Nose:   Nares normal, septum midline, mucosa normal, no drainage    or sinus tenderness  Throat:   Lips, mucosa, and tongue normal; teeth and gums normal  Neck:   Supple, symmetrical, trachea midline, no adenopathy;    thyroid:  no enlargement/tenderness/nodules  Back:     Symmetric, no curvature, ROM normal, no CVA tenderness  Lungs:     respirations unlabored  Chest Wall:    No tenderness or deformity   Heart:    Regular rate and rhythm   Breast Exam:    No tenderness, masses, or nipple abnormality; no axillary lesions noted.   Abdomen:     Soft, non-tender, bowel sounds active all four quadrants,    no masses, no organomegaly  Genitalia:    Normal female without lesion, discharge or tenderness     Extremities:   Extremities normal, atraumatic, no cyanosis or edema  Pulses:   2+ and symmetric all extremities  Skin:   Skin color, texture, turgor normal, no rashes or lesions       Assessment:    Healthy female exam.   Preventative screens   Plan:   F/u PAP with hrHPV Mammogram  Referral for colonoscopy.  Needs Dexa next year.   Brenda Adkins, M.D., Brenda Adkins

## 2019-11-20 LAB — CYTOLOGY - PAP
Comment: NEGATIVE
Diagnosis: NEGATIVE
High risk HPV: NEGATIVE

## 2019-11-26 ENCOUNTER — Other Ambulatory Visit: Payer: Self-pay

## 2019-11-26 ENCOUNTER — Ambulatory Visit
Admission: RE | Admit: 2019-11-26 | Discharge: 2019-11-26 | Disposition: A | Discharge status: Home / Self Care | Payer: Medicaid Other | Source: Ambulatory Visit | Attending: Obstetrics & Gynecology | Admitting: Obstetrics & Gynecology

## 2019-11-26 DIAGNOSIS — Z01419 Encounter for gynecological examination (general) (routine) without abnormal findings: Secondary | ICD-10-CM

## 2019-12-17 ENCOUNTER — Encounter: Payer: Self-pay | Admitting: Gastroenterology

## 2020-02-08 ENCOUNTER — Ambulatory Visit (AMBULATORY_SURGERY_CENTER): Payer: Self-pay | Admitting: *Deleted

## 2020-02-08 ENCOUNTER — Other Ambulatory Visit: Payer: Self-pay

## 2020-02-08 VITALS — Ht 60.0 in | Wt 218.0 lb

## 2020-02-08 DIAGNOSIS — Z1211 Encounter for screening for malignant neoplasm of colon: Secondary | ICD-10-CM

## 2020-02-08 MED ORDER — NA SULFATE-K SULFATE-MG SULF 17.5-3.13-1.6 GM/177ML PO SOLN: 1.0000 | Freq: Once | ORAL | 0 refills | Status: AC

## 2020-02-08 NOTE — Progress Notes (Signed)

## 2020-02-10 ENCOUNTER — Other Ambulatory Visit: Payer: Self-pay

## 2020-02-10 ENCOUNTER — Encounter: Payer: Self-pay | Admitting: Gastroenterology

## 2020-02-10 ENCOUNTER — Ambulatory Visit (AMBULATORY_SURGERY_CENTER): Payer: Medicaid Other | Admitting: Gastroenterology

## 2020-02-10 VITALS — BP 134/82 | HR 67 | Temp 97.8°F | Resp 15 | Ht 60.0 in | Wt 218.0 lb

## 2020-02-10 DIAGNOSIS — Z1211 Encounter for screening for malignant neoplasm of colon: Secondary | ICD-10-CM

## 2020-02-10 MED ORDER — SODIUM CHLORIDE 0.9 % IV SOLN: 500.0000 mL | Freq: Once | INTRAVENOUS | Status: DC

## 2020-02-10 NOTE — Progress Notes (Signed)
Report given, vss 

## 2020-02-10 NOTE — Patient Instructions (Signed)
Information on hemorrhoids given to you today.  Resume previous diet and medications.  YOU HAD AN ENDOSCOPIC PROCEDURE TODAY AT THE [facility]:   Refer to the procedure report that was given to you for any specific questions about what was found during the examination.  If the procedure report does not answer your questions, please call your gastroenterologist to clarify.  If you requested that your care partner not be given the details of your procedure findings, then the procedure report has been included in a sealed envelope for you to review at your convenience later.  YOU SHOULD EXPECT: Some feelings of bloating in the abdomen. Passage of more gas than usual.  Walking can help get rid of the air that was put into your GI tract during the procedure and reduce the bloating. If you had a lower endoscopy (such as a colonoscopy or flexible sigmoidoscopy) you may notice spotting of blood in your stool or on the toilet paper. If you underwent a bowel prep for your procedure, you may not have a normal bowel movement for a few days.  Please Note:  You might notice some irritation and congestion in your nose or some drainage.  This is from the oxygen used during your procedure.  There is no need for concern and it should clear up in a day or so.  SYMPTOMS TO REPORT IMMEDIATELY:   Following lower endoscopy (colonoscopy or flexible sigmoidoscopy):  Excessive amounts of blood in the stool  Significant tenderness or worsening of abdominal pains  Swelling of the abdomen that is new, acute  Fever of 100F or higher    For urgent or emergent issues, a gastroenterologist can be reached at any hour by calling [phone]. Do not use MyChart messaging for urgent concerns.    DIET:  We do recommend a small meal at first, but then you may proceed to your regular diet.  Drink plenty of fluids but you should avoid alcoholic beverages for 24 hours.  ACTIVITY:  You should plan to take it easy  for the rest of today and you should NOT DRIVE or use heavy machinery until tomorrow (because of the sedation medicines used during the test).    FOLLOW UP: Our staff will call the number listed on your records 48-72 hours following your procedure to check on you and address any questions or concerns that you may have regarding the information given to you following your procedure. If we do not reach you, we will leave a message.  We will attempt to reach you two times.  During this call, we will ask if you have developed any symptoms of COVID 19. If you develop any symptoms (ie: fever, flu-like symptoms, shortness of breath, cough etc.) before then, please call [phone].  If you test positive for Covid 19 in the 2 weeks post procedure, please call and report this information to us.    If any biopsies were taken you will be contacted by phone or by letter within the next 1-3 weeks.  Please call us at [phone] if you have not heard about the biopsies in 3 weeks.    SIGNATURES/CONFIDENTIALITY: You and/or your care partner have signed paperwork which will be entered into your electronic medical record.  These signatures attest to the fact that that the information above on your After Visit Summary has been reviewed and is understood.  Full responsibility of the confidentiality of this discharge information lies with you and/or your care-partner. 

## 2020-02-10 NOTE — Op Note (Signed)
[facility] Patient Name: Brenda Adkins Procedure Date: 02/10/2020 3:26 PM MRN: [id number] Endoscopist: Mauri Pole , MD Age: 64 Referring MD:  Date of Birth: 22-Mar-1956 Gender: Female Account #: [id number] Procedure:                Colonoscopy Indications:              Screening for colorectal malignant neoplasm Medicines:                Monitored Anesthesia Care Procedure:                Pre-Anesthesia Assessment:                           - Prior to the procedure, a History and Physical                            was performed, and patient medications and                            allergies were reviewed. The patient's tolerance of                            previous anesthesia was also reviewed. The risks                            and benefits of the procedure and the sedation                            options and risks were discussed with the patient.                            All questions were answered, and informed consent                            was obtained. Prior Anticoagulants: The patient has                            taken no previous anticoagulant or antiplatelet                            agents. ASA Grade Assessment: II - A patient with                            mild systemic disease. After reviewing the risks                            and benefits, the patient was deemed in                            satisfactory condition to undergo the procedure.                           After obtaining informed consent, the colonoscope  was passed under direct vision. Throughout the                            procedure, the patient's blood pressure, pulse, and                            oxygen saturations were monitored continuously. The                            Colonoscope was introduced through the anus and                            advanced to the the cecum, identified by                            appendiceal orifice  and ileocecal valve. The                            colonoscopy was performed without difficulty. The                            patient tolerated the procedure well. The quality                            of the bowel preparation was excellent. The                            ileocecal valve, appendiceal orifice, and rectum                            were photographed. Scope In: 3:37:01 PM Scope Out: 3:53:49 PM Scope Withdrawal Time: 0 hours 10 minutes 47 seconds  Total Procedure Duration: 0 hours 16 minutes 48 seconds  Findings:                 The perianal and digital rectal examinations were                            normal.                           There was a small lipoma, 15 mm in diameter, in the                            transverse colon and in the ascending colon.                           Non-bleeding internal hemorrhoids were found during                            retroflexion. The hemorrhoids were small. Complications:            No immediate complications. Estimated Blood Loss:     Estimated blood loss was minimal. Impression:               - Small lipoma in  the transverse colon and in the                            ascending colon.                           - Non-bleeding internal hemorrhoids.                           - No specimens collected. Recommendation:           - Patient has a contact number available for                            emergencies. The signs and symptoms of potential                            delayed complications were discussed with the                            patient. Return to normal activities tomorrow.                            Written discharge instructions were provided to the                            patient.                           - Resume previous diet.                           - Continue present medications.                           - Repeat colonoscopy in 10 years for surveillance                            based on  pathology results. Mauri Pole, MD 02/10/2020 3:58:25 PM This report has been signed electronically.

## 2020-02-10 NOTE — Progress Notes (Signed)
Pt's states no medical or surgical changes since previsit or office visit. 

## 2020-02-12 ENCOUNTER — Telehealth: Payer: Self-pay | Admitting: *Deleted

## 2020-02-12 NOTE — Telephone Encounter (Signed)
  Follow up Call-  Call back number 02/10/2020  Post procedure Call Back phone  # [phone]  Permission to leave phone message Yes  Some recent data might be hidden     Patient questions:  Do you have a fever, pain , or abdominal swelling? No. Pain Score  0 *  Have you tolerated food without any problems? Yes.    Have you been able to return to your normal activities? Yes.    Do you have any questions about your discharge instructions: Diet   No. Medications  No. Follow up visit  No.  Do you have questions or concerns about your Care? No.  Actions: * If pain score is 4 or above: No action needed, pain <4.  1. Have you developed a fever since your procedure?no  2.   Have you had an respiratory symptoms (SOB or cough) since your procedure? no  3.   Have you tested positive for COVID 19 since your procedure no  4.   Have you had any family members/close contacts diagnosed with the COVID 19 since your procedure? no   If yes to any of these questions please route to Joylene John, RN and Joella Prince, RN

## 2020-02-16 ENCOUNTER — Encounter: Payer: Medicaid Other | Admitting: Gastroenterology

## 2020-02-25 ENCOUNTER — Encounter: Payer: Medicaid Other | Admitting: Gastroenterology

## 2020-10-03 ENCOUNTER — Other Ambulatory Visit: Payer: Self-pay | Admitting: Internal Medicine

## 2020-10-03 DIAGNOSIS — Z1231 Encounter for screening mammogram for malignant neoplasm of breast: Secondary | ICD-10-CM

## 2020-11-21 ENCOUNTER — Ambulatory Visit (INDEPENDENT_AMBULATORY_CARE_PROVIDER_SITE_OTHER): Payer: Medicare Other | Admitting: Obstetrics & Gynecology

## 2020-11-21 ENCOUNTER — Other Ambulatory Visit: Payer: Self-pay

## 2020-11-21 VITALS — BP 113/81 | HR 68 | Wt 215.0 lb

## 2020-11-21 DIAGNOSIS — Z01419 Encounter for gynecological examination (general) (routine) without abnormal findings: Secondary | ICD-10-CM

## 2020-11-21 DIAGNOSIS — Z1231 Encounter for screening mammogram for malignant neoplasm of breast: Secondary | ICD-10-CM | POA: Diagnosis not present

## 2020-11-21 NOTE — Progress Notes (Signed)
Subjective:     Brenda Adkins is a 65 y.o. female here for a routine exam.  Current complaints: none. Pt denies vaginal bleeding or leakage of urine. She reports occ pain in her left breat with lifting. She reports that she has prev been seen by cardiology and had a stress echo which was WNL. She denies pain in her chest with walking.      Gynecologic History No LMP recorded. Patient is postmenopausal. Contraception: post menopausal status Last Pap: 11/18/2019. Results were: normal Last mammogram: 11/26/2019. Results were: normal  Obstetric History OB History  Gravida Para Term Preterm AB Living  '3 3       3  '$ SAB IAB Ectopic Multiple Live Births               # Outcome Date GA Lbr Len/2nd Weight Sex Delivery Anes PTL Lv  3 Para      Vag-Spont     2 Para      Vag-Spont     1 Para      Vag-Spont        The following portions of the patient's history were reviewed and updated as appropriate: allergies, current medications, past family history, past medical history, past social history, past surgical history, and problem list.  Review of Systems Pertinent items are noted in HPI.    Objective:  BP 113/81   Pulse 68   Wt 215 lb (97.5 kg)   BMI 41.99 kg/m   General Appearance:    Alert, cooperative, no distress, appears stated age  Head:    Normocephalic, without obvious abnormality, atraumatic  Eyes:    conjunctiva/corneas clear, EOM's intact, both eyes  Ears:    Normal external ear canals, both ears  Nose:   Nares normal, septum midline, mucosa normal, no drainage    or sinus tenderness  Throat:   Lips, mucosa, and tongue normal; teeth and gums normal  Neck:   Supple, symmetrical, trachea midline, no adenopathy;    thyroid:  no enlargement/tenderness/nodules  Back:     Symmetric, no curvature, ROM normal, no CVA tenderness  Lungs:     respirations unlabored  Chest Wall:    No tenderness or deformity   Heart:    Regular rate and rhythm  Breast Exam:    No tenderness, masses,  or nipple abnormality  Abdomen:     Soft, non-tender, bowel sounds active all four quadrants,    no masses, no organomegaly; large pannus  Genitalia:    Normal female without lesion, discharge or tenderness     Extremities:   Extremities normal, atraumatic, no cyanosis or edema  Pulses:   2+ and symmetric all extremities  Skin:   Skin color, texture, turgor normal, no rashes or lesions     Assessment:    Healthy female exam.  Breast cancer screening   Plan:  [name] was seen today for annual exam.  Diagnoses and all orders for this visit:  Breast cancer screening by mammogram -     MM DIGITAL SCREENING BILATERAL; Future  Well woman exam with routine gynecological exam   [place] Cloe L. Harraway-Smith, M.D., [name]

## 2020-12-12 ENCOUNTER — Other Ambulatory Visit: Payer: Self-pay

## 2020-12-12 ENCOUNTER — Encounter (HOSPITAL_BASED_OUTPATIENT_CLINIC_OR_DEPARTMENT_OTHER): Payer: Self-pay

## 2020-12-12 ENCOUNTER — Ambulatory Visit (HOSPITAL_BASED_OUTPATIENT_CLINIC_OR_DEPARTMENT_OTHER)
Admission: RE | Admit: 2020-12-12 | Discharge: 2020-12-12 | Disposition: A | Discharge status: Home / Self Care | Payer: Medicare Other | Source: Ambulatory Visit | Attending: Obstetrics & Gynecology | Admitting: Obstetrics & Gynecology

## 2020-12-12 DIAGNOSIS — Z1231 Encounter for screening mammogram for malignant neoplasm of breast: Secondary | ICD-10-CM | POA: Insufficient documentation

## 2021-01-30 ENCOUNTER — Ambulatory Visit
Admission: RE | Admit: 2021-01-30 | Discharge: 2021-01-30 | Disposition: A | Discharge status: Home / Self Care | Payer: Medicare Other | Source: Ambulatory Visit | Attending: Internal Medicine | Admitting: Internal Medicine

## 2021-01-30 ENCOUNTER — Other Ambulatory Visit: Payer: Self-pay

## 2021-01-30 DIAGNOSIS — Z1231 Encounter for screening mammogram for malignant neoplasm of breast: Secondary | ICD-10-CM

## 2021-05-08 ENCOUNTER — Ambulatory Visit (INDEPENDENT_AMBULATORY_CARE_PROVIDER_SITE_OTHER): Payer: Medicare Other | Admitting: Obstetrics & Gynecology

## 2021-05-08 ENCOUNTER — Encounter: Payer: Self-pay | Admitting: Obstetrics & Gynecology

## 2021-05-08 ENCOUNTER — Other Ambulatory Visit: Payer: Self-pay

## 2021-05-08 ENCOUNTER — Other Ambulatory Visit (HOSPITAL_COMMUNITY)
Admission: RE | Admit: 2021-05-08 | Discharge: 2021-05-08 | Disposition: A | Discharge status: Home / Self Care | Payer: Medicare Other | Source: Ambulatory Visit | Attending: Obstetrics & Gynecology | Admitting: Obstetrics & Gynecology

## 2021-05-08 VITALS — BP 115/62 | HR 73 | Wt 214.0 lb

## 2021-05-08 DIAGNOSIS — N950 Postmenopausal bleeding: Secondary | ICD-10-CM | POA: Diagnosis present

## 2021-05-08 DIAGNOSIS — L292 Pruritus vulvae: Secondary | ICD-10-CM | POA: Insufficient documentation

## 2021-05-08 DIAGNOSIS — Z01419 Encounter for gynecological examination (general) (routine) without abnormal findings: Secondary | ICD-10-CM | POA: Diagnosis not present

## 2021-05-08 DIAGNOSIS — R87610 Atypical squamous cells of undetermined significance on cytologic smear of cervix (ASC-US): Secondary | ICD-10-CM | POA: Diagnosis not present

## 2021-05-08 DIAGNOSIS — Z1151 Encounter for screening for human papillomavirus (HPV): Secondary | ICD-10-CM | POA: Diagnosis not present

## 2021-05-08 NOTE — Progress Notes (Signed)
GYNECOLOGY OFFICE VISIT NOTE  History:   Brenda Adkins is a 66 y.o. G3P3 here today for evaluation of her third episode of light postmenopausal spotting. Accompanied by her daughter who helped provide the history; also Urdu  audio interpreter used during this encounter.  Last episode was in 2019, and she was noted to have a 3 mm endometrial stripe on ultrasound consistent with atrophy. Endometrial biopsy showed benign pathology.  The first episode was in 2016, also had a 4 mm endometrial stripe and benign endometrial biopsy.  This episode started a week ago, and she had light spotting and light brown discharge and some pelvic cramping.  She reports also having some vulvar itching for the past three days.  She denies any other concerns.    Past Medical History:  Diagnosis Date   Diabetes mellitus without complication ([place])    Hypertension    Sleep apnea    wears c-pap   Thyroid disease     Past Surgical History:  Procedure Laterality Date   TENDON REPAIR Right     The following portions of the patient's history were reviewed and updated as appropriate: allergies, current medications, past family history, past medical history, past social history, past surgical history and problem list.   Health Maintenance:  Normal pap and negative HRHPV on 11/18/2019.  Normal mammogram on 01/30/2021.   Review of Systems:  Pertinent items noted in HPI and remainder of comprehensive ROS otherwise negative.  Physical Exam:  BP 115/62    Pulse 73    Wt 214 lb (97.1 kg)    BMI 41.79 kg/m  CONSTITUTIONAL: Well-developed, well-nourished female in no acute distress.  HEENT:  Normocephalic, atraumatic. External right and left ear normal. No scleral icterus.  NECK: Normal range of motion, supple, no masses noted on observation SKIN: No rash noted. Not diaphoretic. No erythema. No pallor. MUSCULOSKELETAL: Normal range of motion. No edema noted. NEUROLOGIC: Alert and oriented to person, place, and time.  Normal muscle tone coordination. No cranial nerve deficit noted. PSYCHIATRIC: Normal mood and affect. Normal behavior. Normal judgment and thought content. CARDIOVASCULAR: Normal heart rate noted RESPIRATORY: Effort and breath sounds normal, no problems with respiration noted ABDOMEN: No masses noted. No other overt distention noted.   PELVIC: Normal appearing external genitalia; normal urethral meatus; normal appearing vaginal mucosa and cervix.  Brown discharge noted, no active bleeding. Pap smear sample obtained.  Normal uterine size, no other palpable masses, no uterine or adnexal tenderness. Performed in the presence of a chaperone     Assessment and Plan:     1. Postmenopausal bleeding Negative evaluation x 2 previous episodes, but still needs evaluation this time. Offered endometrial biopsy, she declined this as she wants to be premedicated.  Pap smear done, will follow up results and manage accordingly. Will also follow up vaginitis evaluation. Ultrasound ordered, patient will be contacted with results and follow up plans (endometrial biopsy very likely or Hysteroscopy, D&C).   - Cytology - PAP - US PELVIC COMPLETE WITH TRANSVAGINAL; Future  2. Vulvar itching - Cervicovaginal ancillary only done, will follow up results and manage accordingly.  Routine preventative health maintenance measures emphasized. Please refer to After Visit Summary for other counseling recommendations.   Return for any gynecologic concerns.    I spent 20 minutes dedicated to the care of this patient including pre-visit review of records, face to face time with the patient discussing her conditions and treatments and post visit orders.    Verita Schneiders, MD,  [place] [facility] [employer], [place]

## 2021-05-09 ENCOUNTER — Ambulatory Visit (HOSPITAL_BASED_OUTPATIENT_CLINIC_OR_DEPARTMENT_OTHER)
Admission: RE | Admit: 2021-05-09 | Discharge: 2021-05-09 | Disposition: A | Discharge status: Home / Self Care | Payer: Medicare Other | Source: Ambulatory Visit | Attending: Obstetrics & Gynecology | Admitting: Obstetrics & Gynecology

## 2021-05-09 ENCOUNTER — Other Ambulatory Visit (HOSPITAL_BASED_OUTPATIENT_CLINIC_OR_DEPARTMENT_OTHER): Payer: Medicare Other

## 2021-05-09 DIAGNOSIS — N950 Postmenopausal bleeding: Secondary | ICD-10-CM | POA: Diagnosis not present

## 2021-05-09 LAB — CERVICOVAGINAL ANCILLARY ONLY
Bacterial Vaginitis (gardnerella): NEGATIVE
Candida Glabrata: NEGATIVE
Candida Vaginitis: NEGATIVE
Comment: NEGATIVE
Comment: NEGATIVE
Comment: NEGATIVE

## 2021-05-10 ENCOUNTER — Telehealth: Payer: Self-pay

## 2021-05-10 NOTE — Telephone Encounter (Signed)
-----   Message from Osborne Oman, MD sent at 05/09/2021 11:55 AM EST ----- Endometrial atrophy noted. No need for repeat biopsy at this time unless bleeding worsens.  Please call to inform patient of results and recommendations.

## 2021-05-10 NOTE — Telephone Encounter (Signed)
Spoke with patients daughter and explained no need for further biopsy at this time. That it was noted on ultrasound just endometrial atrophy. Patients daughter states understanding and will rely information to her mother. [name] RN

## 2021-05-15 LAB — CYTOLOGY - PAP
Comment: NEGATIVE
Diagnosis: UNDETERMINED — AB
High risk HPV: NEGATIVE

## 2021-11-30 ENCOUNTER — Encounter: Payer: Self-pay | Admitting: General Practice

## 2022-01-10 ENCOUNTER — Other Ambulatory Visit: Payer: Self-pay | Admitting: Family Medicine

## 2022-01-10 DIAGNOSIS — Z1231 Encounter for screening mammogram for malignant neoplasm of breast: Secondary | ICD-10-CM

## 2022-02-09 ENCOUNTER — Ambulatory Visit
Admission: RE | Admit: 2022-02-09 | Discharge: 2022-02-09 | Disposition: A | Discharge status: Home / Self Care | Payer: Medicare Other | Source: Ambulatory Visit | Attending: Family Medicine | Admitting: Family Medicine

## 2022-02-09 DIAGNOSIS — Z1231 Encounter for screening mammogram for malignant neoplasm of breast: Secondary | ICD-10-CM

## 2022-03-12 ENCOUNTER — Ambulatory Visit (INDEPENDENT_AMBULATORY_CARE_PROVIDER_SITE_OTHER): Payer: Medicare Other | Admitting: Family Medicine

## 2022-03-12 ENCOUNTER — Encounter: Payer: Self-pay | Admitting: Family Medicine

## 2022-03-12 VITALS — BP 141/70 | HR 67 | Wt 213.0 lb

## 2022-03-12 DIAGNOSIS — Z01419 Encounter for gynecological examination (general) (routine) without abnormal findings: Secondary | ICD-10-CM | POA: Diagnosis not present

## 2022-03-12 NOTE — Progress Notes (Signed)
Last Mammogram 02/12/22 Last pap 05/08/21: ASCUS  In person interpreter used for visit. [name] RN

## 2022-03-12 NOTE — Progress Notes (Signed)
Subjective:     Brenda Adkins is a 66 y.o. female and is here for a comprehensive physical exam. The patient reports no problems.  Has history of ASCUS Pap with negative HPV.  Reports some pain with heavy lifting in her breast but no masses.  Patient with normal mammogram 10/23  The following portions of the patient's history were reviewed and updated as appropriate: allergies, current medications, past family history, past medical history, past social history, past surgical history, and problem list.  Review of Systems Pertinent items noted in HPI and remainder of comprehensive ROS otherwise negative.   Objective:    BP (!) 141/70   Pulse 67   Wt 213 lb (96.6 kg)   BMI 41.60 kg/m  General appearance: alert, cooperative, and appears stated age Head: Normocephalic, without obvious abnormality, atraumatic Neck: supple, symmetrical, trachea midline and thyroid not enlarged, symmetric, no tenderness/mass/nodules Lungs: clear to auscultation bilaterally Breasts: normal appearance, no masses or tenderness Heart: regular rate and rhythm, S1, S2 normal, no murmur, click, rub or gallop Abdomen: soft, non-tender; bowel sounds normal; no masses,  no organomegaly Extremities: extremities normal, atraumatic, no cyanosis or edema Skin: Skin color, texture, turgor normal. No rashes or lesions Neurologic: Grossly normal    Assessment:    GYN female exam.    Plan:  Well woman exam with routine gynecological exam - Normal breast exam.  No longer needs annual Pap smears as hers have all been negative HPV.  Return in 1 year (on 03/13/2023), or if symptoms worsen or fail to improve.    See After Visit Summary for Counseling Recommendations

## 2022-11-01 IMAGING — MG MM DIGITAL SCREENING BILAT W/ TOMO AND CAD
8 series · 8 of 24 positions shown · non-contrast
Comparison: Previous exam(s).

CLINICAL DATA: Screening.

EXAM:
DIGITAL SCREENING BILATERAL MAMMOGRAM WITH TOMOSYNTHESIS AND CAD
TECHNIQUE: Bilateral screening digital craniocaudal and mediolateral oblique
mammograms were obtained. Bilateral screening digital breast
tomosynthesis was performed. The images were evaluated with
computer-aided detection.

[R CC synth-2D]
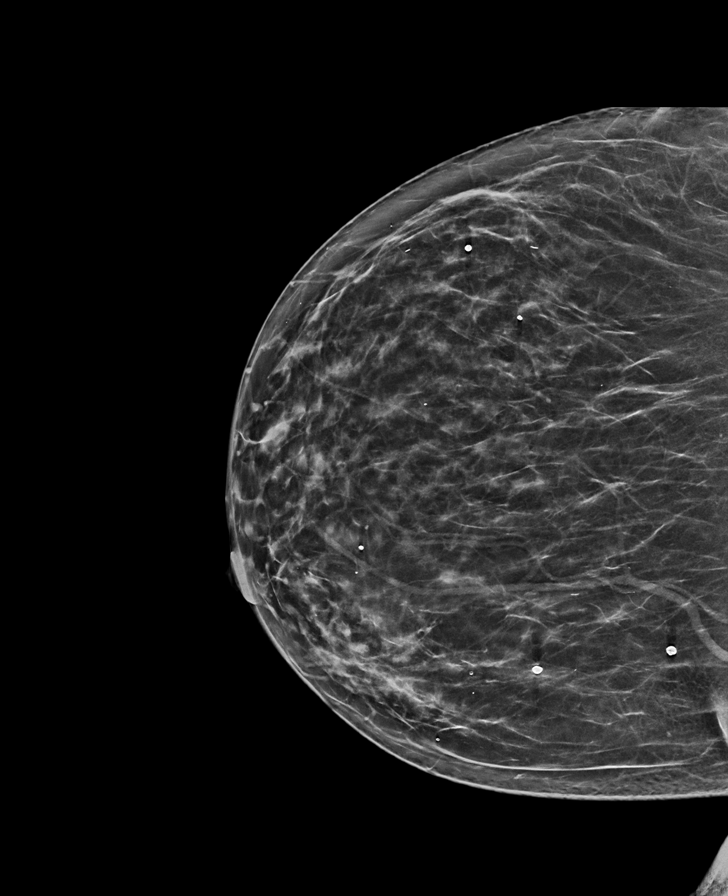

[L CC synth-2D]
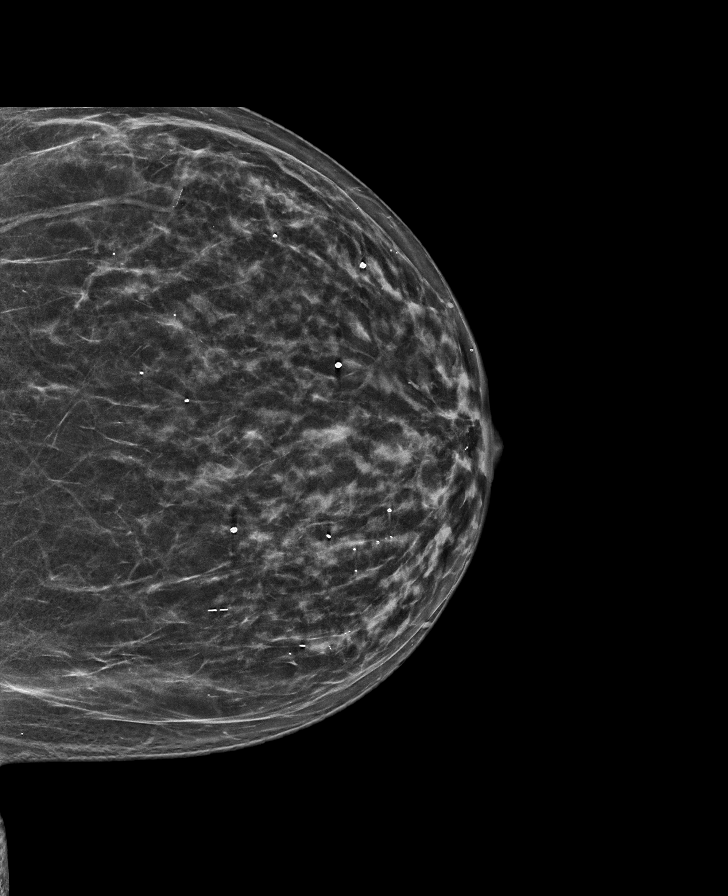

[L MLO synth-2D]
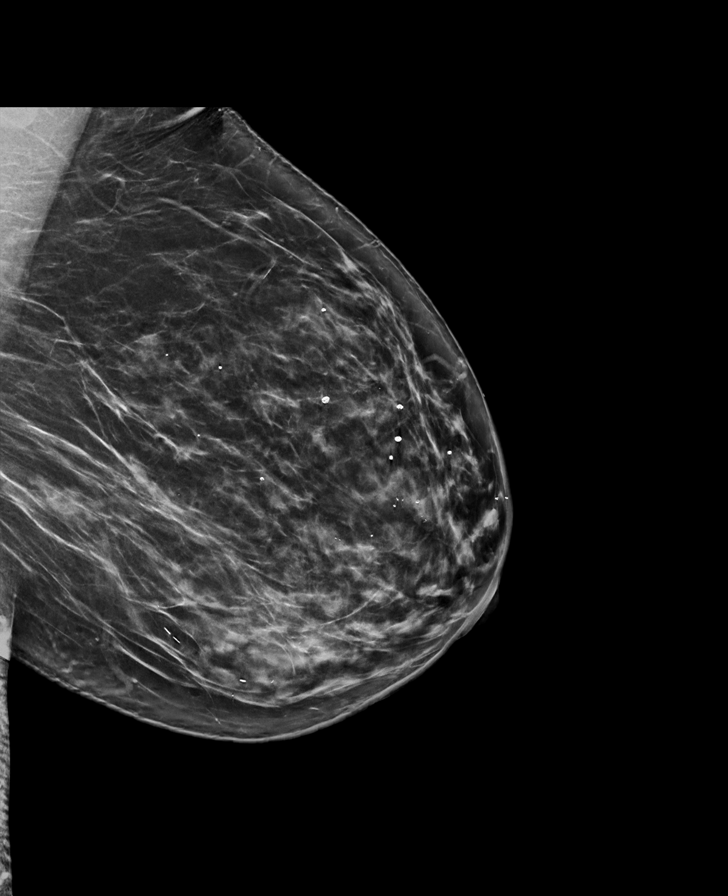

[R MLO synth-2D]
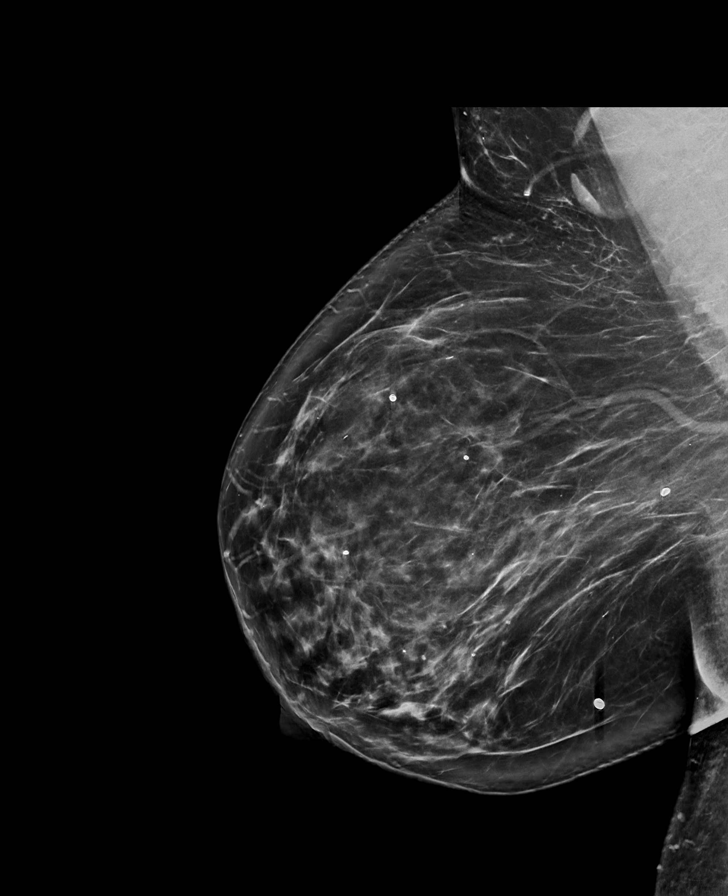

[L CC tomo · tomo slice 33/66.0]
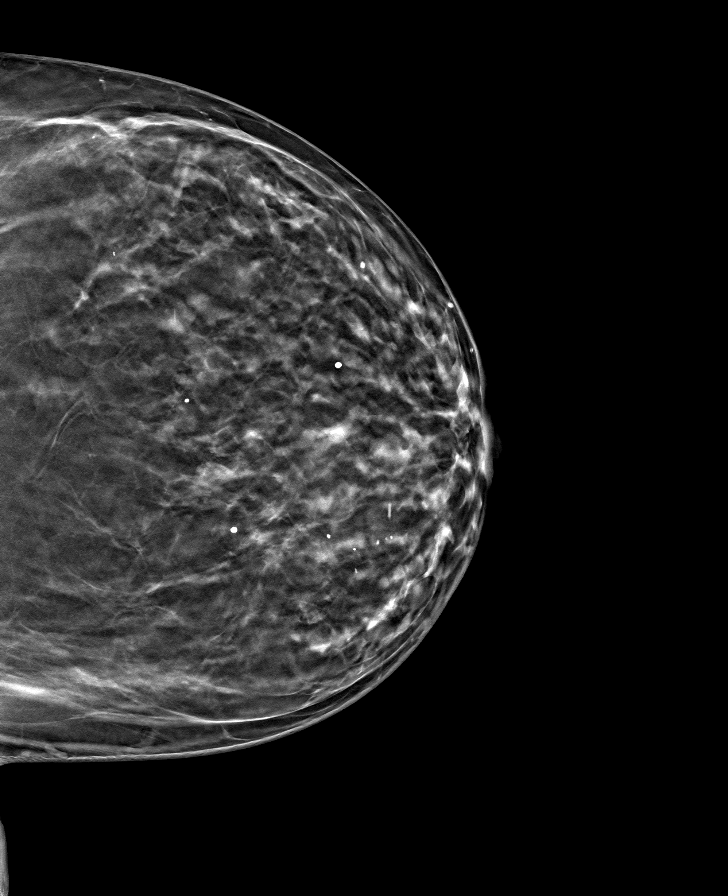

[R CC tomo · tomo slice 35/68.0]
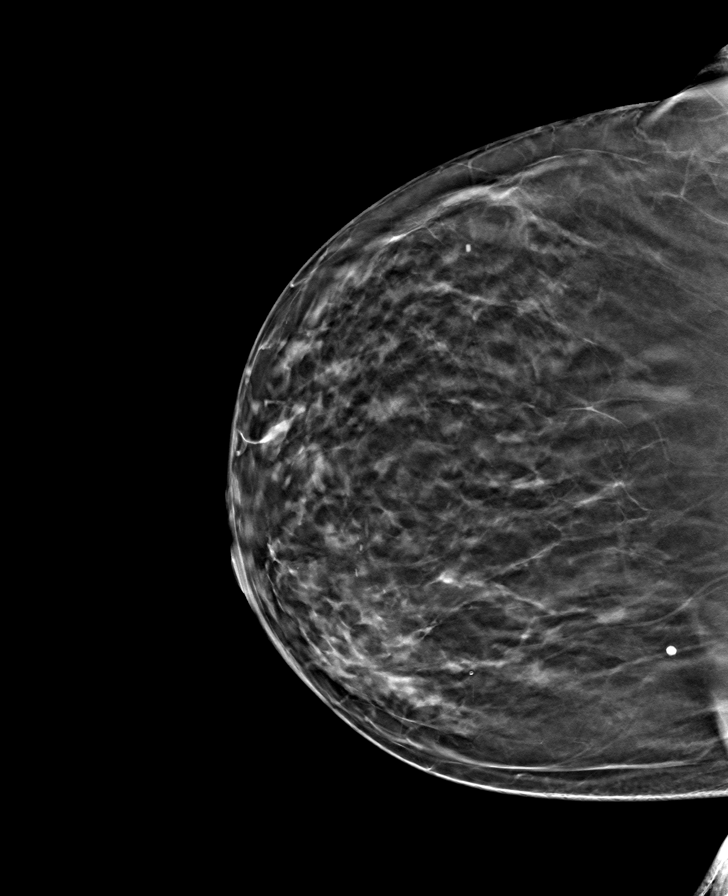

[L MLO tomo · tomo slice 41/82.0]
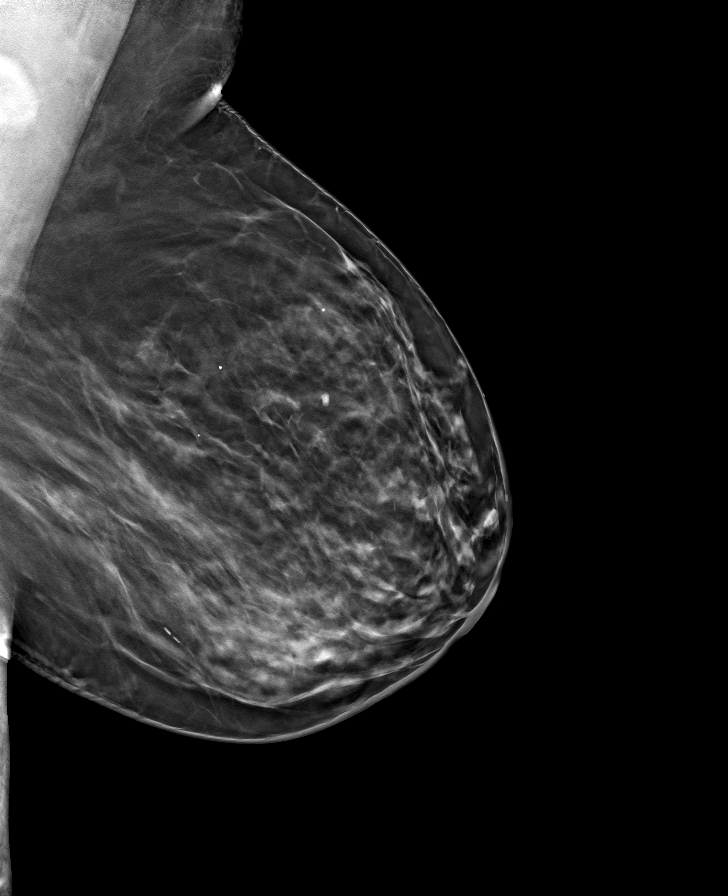

[R MLO tomo · tomo slice 45/90.0]
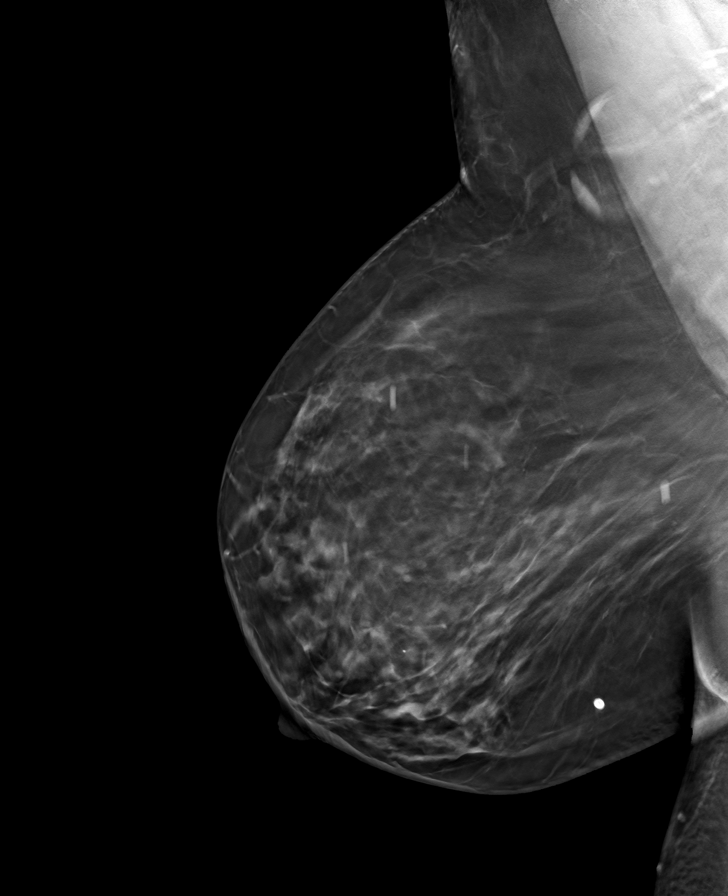

[8 of 24 positions shown; findings below may reference images not displayed]

ACR Breast Density Category b: There are scattered areas of
fibroglandular density.
FINDINGS: There are no findings suspicious for malignancy.
IMPRESSION: No mammographic evidence of malignancy. A result letter of this
screening mammogram will be mailed directly to the patient.

RECOMMENDATION:
Screening mammogram in one year. (Code:51-O-LD2)

BI-RADS CATEGORY  1: Negative.

## 2023-01-14 ENCOUNTER — Other Ambulatory Visit: Payer: Self-pay | Admitting: Family Medicine

## 2023-01-14 DIAGNOSIS — Z1231 Encounter for screening mammogram for malignant neoplasm of breast: Secondary | ICD-10-CM

## 2023-02-11 ENCOUNTER — Ambulatory Visit
Admission: RE | Admit: 2023-02-11 | Discharge: 2023-02-11 | Disposition: A | Discharge status: Home / Self Care | Payer: Medicare HMO | Source: Ambulatory Visit | Attending: Family Medicine | Admitting: Family Medicine

## 2023-02-11 DIAGNOSIS — Z1231 Encounter for screening mammogram for malignant neoplasm of breast: Secondary | ICD-10-CM

## 2023-08-03 ENCOUNTER — Emergency Department (HOSPITAL_BASED_OUTPATIENT_CLINIC_OR_DEPARTMENT_OTHER)
Admission: EM | Admit: 2023-08-03 | Discharge: 2023-08-03 | Disposition: A | Discharge status: Home / Self Care | Attending: Emergency Medicine | Admitting: Emergency Medicine

## 2023-08-03 ENCOUNTER — Encounter (HOSPITAL_BASED_OUTPATIENT_CLINIC_OR_DEPARTMENT_OTHER): Payer: Self-pay

## 2023-08-03 ENCOUNTER — Emergency Department (HOSPITAL_BASED_OUTPATIENT_CLINIC_OR_DEPARTMENT_OTHER)

## 2023-08-03 DIAGNOSIS — E119 Type 2 diabetes mellitus without complications: Secondary | ICD-10-CM | POA: Diagnosis not present

## 2023-08-03 DIAGNOSIS — Z7984 Long term (current) use of oral hypoglycemic drugs: Secondary | ICD-10-CM | POA: Insufficient documentation

## 2023-08-03 DIAGNOSIS — M1711 Unilateral primary osteoarthritis, right knee: Secondary | ICD-10-CM | POA: Insufficient documentation

## 2023-08-03 DIAGNOSIS — I10 Essential (primary) hypertension: Secondary | ICD-10-CM | POA: Diagnosis not present

## 2023-08-03 DIAGNOSIS — M25561 Pain in right knee: Secondary | ICD-10-CM

## 2023-08-03 DIAGNOSIS — Z79899 Other long term (current) drug therapy: Secondary | ICD-10-CM | POA: Diagnosis not present

## 2023-08-03 DIAGNOSIS — M79604 Pain in right leg: Secondary | ICD-10-CM | POA: Diagnosis present

## 2023-08-03 LAB — CBC WITH DIFFERENTIAL/PLATELET
Abs Immature Granulocytes: 0.04 10*3/uL (ref 0.00–0.07)
Basophils Absolute: 0.1 10*3/uL (ref 0.0–0.1)
Basophils Relative: 1 %
Eosinophils Absolute: 0.2 10*3/uL (ref 0.0–0.5)
Eosinophils Relative: 2 %
HCT: 38.8 % (ref 36.0–46.0)
Hemoglobin: 12.8 g/dL (ref 12.0–15.0)
Immature Granulocytes: 0 %
Lymphocytes Relative: 24 %
Lymphs Abs: 2.2 10*3/uL (ref 0.7–4.0)
MCH: 27.8 pg (ref 26.0–34.0)
MCHC: 33 g/dL (ref 30.0–36.0)
MCV: 84.2 fL (ref 80.0–100.0)
Monocytes Absolute: 0.7 10*3/uL (ref 0.1–1.0)
Monocytes Relative: 8 %
Neutro Abs: 5.8 10*3/uL (ref 1.7–7.7)
Neutrophils Relative %: 65 %
Platelets: 245 10*3/uL (ref 150–400)
RBC: 4.61 MIL/uL (ref 3.87–5.11)
RDW: 13.5 % (ref 11.5–15.5)
WBC: 9.1 10*3/uL (ref 4.0–10.5)
nRBC: 0 % (ref 0.0–0.2)

## 2023-08-03 LAB — BASIC METABOLIC PANEL WITH GFR
Anion gap: 10 (ref 5–15)
BUN: 23 mg/dL (ref 8–23)
CO2: 28 mmol/L (ref 22–32)
Calcium: 9.6 mg/dL (ref 8.9–10.3)
Chloride: 99 mmol/L (ref 98–111)
Creatinine, Ser: 0.96 mg/dL (ref 0.44–1.00)
GFR, Estimated: >60 mL/min (ref >60)
Glucose, Bld: 97 mg/dL (ref 70–99)
Potassium: 3.3 mmol/L — ABNORMAL LOW (ref 3.5–5.1)
Sodium: 137 mmol/L (ref 135–145)

## 2023-08-03 MED ORDER — METHYLPREDNISOLONE SODIUM SUCC 125 MG IJ SOLR
125.0000 mg | Freq: Once | INTRAMUSCULAR | Status: AC
  Administered 2023-08-03: 125 mg via INTRAMUSCULAR
  Filled 2023-08-03: qty 2

## 2023-08-03 MED ORDER — OXYCODONE HCL 5 MG PO TABS
5.0000 mg | ORAL_TABLET | Freq: Once | ORAL | Status: AC
  Administered 2023-08-03: 5 mg via ORAL
  Filled 2023-08-03: qty 1

## 2023-08-03 MED ORDER — ACETAMINOPHEN 500 MG PO TABS
1000.0000 mg | ORAL_TABLET | Freq: Once | ORAL | Status: AC
  Administered 2023-08-03: 1000 mg via ORAL
  Filled 2023-08-03: qty 2

## 2023-08-03 MED ORDER — POTASSIUM CHLORIDE CRYS ER 20 MEQ PO TBCR
40.0000 meq | EXTENDED_RELEASE_TABLET | Freq: Once | ORAL | Status: AC
  Administered 2023-08-03: 40 meq via ORAL
  Filled 2023-08-03: qty 2

## 2023-08-03 MED ORDER — ETODOLAC 400 MG PO TABS: 400.0000 mg | ORAL_TABLET | Freq: Two times a day (BID) | ORAL | 0 refills | Status: AC

## 2023-08-03 NOTE — Discharge Instructions (Addendum)
 Your workup today was reassuring.  Ultrasound did not show any concern for blood clot.  X-ray did show evidence of arthritis.  Blood work was reassuring.  Your potassium was slightly low for which she received a potassium supplement.  I have given you follow-up for your orthopedist.  Please call their office Monday to schedule an appointment.  Follow-up with your primary care doctor as needed. I have prescribed you a course of Lodine which is an anti-inflammatory medication.  Do not combine this with other anti-inflammatory medications such as ibuprofen or Aleve.  You are able to take Tylenol 1000 mg every 6 hours in addition to this medication.

## 2023-08-03 NOTE — ED Provider Notes (Signed)
 [place] EMERGENCY DEPARTMENT AT [facility] [place] Provider Note   CSN: [id number] Arrival date & time: 08/03/23  7253     History  No chief complaint on file.   Shalena Ezzell is a 68 y.o. female.  68 year old female presents with her son for concern of right leg pain.  She states the pain has been going on for some time however over the past week it has become unbearable to the point she has not been able to bear weight.  She has not taken anything for this prior to arrival.  Denies chest pain, shortness of breath.  Denies any injury.  She states it originates around the right knee and radiates distally as well as proximally.  She states about 10 years ago she had surgery.  She is not entirely sure what the surgery was for.  Per chart review it appears it was a lipoma excision.  The history is provided by the patient. No language interpreter was used.       Home Medications Prior to Admission medications   Medication Sig Start Date End Date Taking? Authorizing Provider  albuterol (VENTOLIN HFA) 108 (90 Base) MCG/ACT inhaler Inhale 2 puffs into the lungs.  Patient not taking: Reported on 02/10/2020    [provider]  allopurinol (ZYLOPRIM) 300 MG tablet Take 300 mg by mouth daily.    [provider]  ALPRAZolam Prudy Feeler) 0.25 MG tablet Take 0.25 mg by mouth at bedtime as needed for anxiety. Patient not taking: Reported on 02/10/2020    [provider]  escitalopram (LEXAPRO) 10 MG tablet Take 10 mg by mouth daily. Patient not taking: Reported on 02/10/2020    [provider]  levothyroxine (SYNTHROID, LEVOTHROID) 50 MCG tablet Take 50 mcg by mouth.    [provider]  losartan-hydrochlorothiazide (HYZAAR) 100-12.5 MG per tablet Take 1 tablet by mouth daily.     [provider]  metFORMIN (GLUCOPHAGE-XR) 500 MG 24 hr tablet Take 500 mg by mouth.     [provider]  montelukast (SINGULAIR) 10 MG tablet Take 10 mg  by mouth at bedtime.    [provider]  ofloxacin (FLOXIN) 0.3 % OTIC solution 5 drops daily.    [provider]  tolterodine (DETROL LA) 2 MG 24 hr capsule Take 1 capsule (2 mg total) by mouth daily. Patient not taking: Reported on 02/08/2020 07/16/16   Willodean Rosenthal, MD      Allergies    Pollen extract    Review of Systems   Review of Systems  Constitutional:  Negative for chills and fever.  Respiratory:  Negative for shortness of breath.   Cardiovascular:  Positive for leg swelling. Negative for chest pain.  Musculoskeletal:  Positive for arthralgias.  All other systems reviewed and are negative.   Physical Exam Updated Vital Signs BP 138/78 (BP Location: Right Arm)   Pulse 79   Temp 98 F (36.7 C) (Oral)   Resp 18   Ht 5\' 1"  (1.549 m)   Wt 95.3 kg   SpO2 95%   BMI 39.68 kg/m  Physical Exam Vitals and nursing note reviewed.  Constitutional:      General: She is not in acute distress.    Appearance: Normal appearance. She is not ill-appearing.  HENT:     Head: Normocephalic and atraumatic.     Nose: Nose normal.  Eyes:     Conjunctiva/sclera: Conjunctivae normal.  Pulmonary:     Effort: Pulmonary effort is normal. No respiratory  distress.  Musculoskeletal:        General: No deformity. Normal range of motion.     Comments: Good range of motion in bilateral lower extremities including in bilateral hips, knees, ankles.  Neurovascularly intact in bilateral lower extremities.  Lumbar spine without tenderness to palpation.  Pain is reproduced with knee flexion and hip flexion.  5/5 strength in bilateral lower extremities.  Skin:    Findings: No rash.  Neurological:     Mental Status: She is alert.     ED Results / Procedures / Treatments   Labs (all labs ordered are listed, but only abnormal results are displayed) Labs Reviewed  CBC WITH DIFFERENTIAL/PLATELET  BASIC METABOLIC PANEL WITH GFR    EKG None  Radiology No results  found.  Procedures Procedures    Medications Ordered in ED Medications  acetaminophen (TYLENOL) tablet 1,000 mg (has no administration in time range)  oxyCODONE (Oxy IR/ROXICODONE) immediate release tablet 5 mg (has no administration in time range)    ED Course/ Medical Decision Making/ A&P                                 Medical Decision Making Amount and/or Complexity of Data Reviewed Labs: ordered. Radiology: ordered.  Risk OTC drugs. Prescription drug management.   Medical Decision Making / ED Course   This patient presents to the ED for concern of right leg pain, this involves an extensive number of treatment options, and is a complaint that carries with it a high risk of complications and morbidity.  The differential diagnosis includes DVT, osteoarthritis, fracture, knee sprain  MDM: 68 year old female presents today for concern of right leg pain.  This has been a chronic issue but worse in the past week.  Also endorses leg swelling.  DVT study obtained.  No evidence of DVT.  Right knee x-ray obtained.  Shows small joint effusion, as well as moderate degenerative change. Will give dose of Solu-Medrol.  Will prescribe Lodine.  Will give referral to orthopedist.  Low suspicion for septic joint given afebrile, without tachycardia, without overlying erythema, warmth, and without leukocytosis.  She also has good range of motion and no significant tenderness to palpation.  Discussed follow-up with orthopedist.  They voiced understanding and are in agreement with plan.  Strict return precautions given.   Additional history obtained: -Additional history obtained from son at bedside -External records from outside source obtained and reviewed including: Chart review including previous notes, labs, imaging, consultation notes   Lab Tests: -I ordered, reviewed, and interpreted labs.   The pertinent results include:   Labs Reviewed  BASIC METABOLIC PANEL WITH GFR -  Abnormal; Notable for the following components:      Result Value   Potassium 3.3 (*)    All other components within normal limits  CBC WITH DIFFERENTIAL/PLATELET      EKG  EKG Interpretation Date/Time:    Ventricular Rate:    PR Interval:    QRS Duration:    QT Interval:    QTC Calculation:   R Axis:      Text Interpretation:           Imaging Studies ordered: I ordered imaging studies including right knee x-ray, DVT study I independently visualized and interpreted imaging. I agree with the radiologist interpretation   Medicines ordered and prescription drug management: Meds ordered this encounter  Medications   acetaminophen (TYLENOL) tablet 1,000 mg  oxyCODONE (Oxy IR/ROXICODONE) immediate release tablet 5 mg    Refill:  0    -I have reviewed the patients home medicines and have made adjustments as needed  Reevaluation: After the interventions noted above, I reevaluated the patient and found that they have :improved  Co morbidities that complicate the patient evaluation  Past Medical History:  Diagnosis Date   Diabetes mellitus without complication ([facility])    Hypertension    Sleep apnea    wears c-pap   Thyroid disease       Dispostion: Discharged in stable condition.  Return precaution discussed.  Patient voices understanding and is in agreement with plan.   Final Clinical Impression(s) / ED Diagnoses Final diagnoses:  Right knee pain, unspecified chronicity  Arthritis of right knee    Rx / DC Orders ED Discharge Orders          Ordered    etodolac (LODINE) 400 MG tablet  2 times daily        08/03/23 1111              Marita [place], [place] 08/03/23 1112    Pricilla Loveless, MD 08/04/23 1440

## 2023-08-03 NOTE — ED Triage Notes (Signed)
 Pt son is interpreting for pt. Pt reports right leg pain x 1 week. Pt states that it is hard  to stand and to put pressure on it to stand. States that her leg is swelling. Denies SOB

## 2023-08-03 NOTE — ED Notes (Signed)

## 2023-08-03 NOTE — ED Notes (Signed)
 [place] at bedside

## 2023-08-28 ENCOUNTER — Ambulatory Visit (INDEPENDENT_AMBULATORY_CARE_PROVIDER_SITE_OTHER): Admitting: Orthopaedic Surgery

## 2023-08-28 VITALS — Ht 61.0 in | Wt 211.0 lb

## 2023-08-28 DIAGNOSIS — G8929 Other chronic pain: Secondary | ICD-10-CM

## 2023-08-28 DIAGNOSIS — M25561 Pain in right knee: Secondary | ICD-10-CM

## 2023-08-28 DIAGNOSIS — M1711 Unilateral primary osteoarthritis, right knee: Secondary | ICD-10-CM | POA: Diagnosis not present

## 2023-08-28 MED ORDER — LIDOCAINE HCL 1 % IJ SOLN
3.0000 mL | INTRAMUSCULAR | Status: AC | PRN
  Administered 2023-08-28: 3 mL

## 2023-08-28 MED ORDER — METHYLPREDNISOLONE ACETATE 40 MG/ML IJ SUSP
40.0000 mg | INTRAMUSCULAR | Status: AC | PRN
  Administered 2023-08-28: 40 mg via INTRA_ARTICULAR

## 2023-08-28 NOTE — Progress Notes (Signed)
 The patient is a 68 year old female originally from [place] who comes in for evaluation treatment of chronic right knee pain that became acute recently.  She went to the emergency room recently due to a lot of pain in her knee and x-rays were obtained showing tricompartmental arthritis of her right knee.  There is been no known injury.  She does ambulate using a cane in her opposite side.  She does have a remote history of some type of surgical intervention on that knee.  Her daughter is with her who interprets for her.  I was able to review all of her past medical history and medications within epic.  She is a type II diabetic.  Again I did see her other medications and past medical history within epic.  Examination of her right knee shows patellofemoral crepitation throughout the arc of motion of the knee.  There is slight varus malalignment.  There is medial and lateral joint line tenderness.  The knee is feels ligamentously stable.  X-rays on the canopy system of her right knee show tricompartment arthritis with medial and patellofemoral narrowing and osteophytes in all 3 compartments.  We had a long and thorough discussion about knee replacement surgery.  She has not had any type of steroid injection in her knee so she is appropriate candidate for steroid injection today which she agreed to and tolerated well.  Of note her BMI is 39.87 so we want her to try to lose weight and work on quad strengthening exercises.  We will see her back in about 6 weeks to see how she is doing overall and we can see if hyaluronic acid is a possibility after that visit.    Procedure Note  Patient: Brenda Adkins             Date of Birth: [birth year]           MRN: [id number]             Visit Date: 08/28/2023  Procedures: Visit Diagnoses:  1. Chronic pain of right knee   2. Unilateral primary osteoarthritis, right knee     Large Joint Inj: R knee on 08/28/2023 10:36 AM Indications: diagnostic evaluation and  pain Details: 22 G 1.5 in needle, superolateral approach  Arthrogram: No  Medications: 3 mL lidocaine 1 %; 40 mg methylPREDNISolone  acetate 40 MG/ML Outcome: tolerated well, no immediate complications Procedure, treatment alternatives, risks and benefits explained, specific risks discussed. Consent was given by the patient. Immediately prior to procedure a time out was called to verify the correct patient, procedure, equipment, support staff and site/side marked as required. Patient was prepped and draped in the usual sterile fashion.

## 2023-09-25 ENCOUNTER — Encounter: Payer: Self-pay | Admitting: Orthopaedic Surgery

## 2023-09-25 ENCOUNTER — Ambulatory Visit (INDEPENDENT_AMBULATORY_CARE_PROVIDER_SITE_OTHER): Admitting: Orthopaedic Surgery

## 2023-09-25 DIAGNOSIS — M25561 Pain in right knee: Secondary | ICD-10-CM | POA: Diagnosis not present

## 2023-09-25 DIAGNOSIS — G8929 Other chronic pain: Secondary | ICD-10-CM | POA: Diagnosis not present

## 2023-09-25 DIAGNOSIS — M1711 Unilateral primary osteoarthritis, right knee: Secondary | ICD-10-CM | POA: Diagnosis not present

## 2023-09-25 MED ORDER — METHOCARBAMOL 500 MG PO TABS: 500.0000 mg | ORAL_TABLET | Freq: Three times a day (TID) | ORAL | 1 refills | Status: AC | PRN

## 2023-09-25 NOTE — Progress Notes (Signed)
 The patient comes in for follow-up for her right knee.  She is with her daughter who interprets for her.  She said the steroid injection did help quite a bit to treat the pain from osteoarthritis in the right knee.  She really hurts more in her calfs on both sides but is really globally around both legs and it feels weak and stiff.  On exam her right knee does have varus malalignment but it is not really painful today and there is no effusion.  She seems to hurt just globally around her legs and some of this may be Pes bursitis and tendinitis bilaterally.  I have encouraged the use of topical anti-inflammatories and creams.  I will also send in Robaxin that can hopefully help with some of the stiffness of the muscles.  I have recommended outpatient physical therapy at [facility] for her legs with any modalities that can help her feel better per the therapist discretion.  She does prefer a female physical therapist for religious purposes.  Will then see her back in 6 weeks after course of therapy.

## 2023-09-26 ENCOUNTER — Other Ambulatory Visit: Payer: Self-pay

## 2023-09-26 DIAGNOSIS — G8929 Other chronic pain: Secondary | ICD-10-CM

## 2023-11-06 ENCOUNTER — Encounter: Payer: Self-pay | Admitting: Orthopaedic Surgery

## 2023-11-06 ENCOUNTER — Ambulatory Visit (INDEPENDENT_AMBULATORY_CARE_PROVIDER_SITE_OTHER): Admitting: Orthopaedic Surgery

## 2023-11-06 DIAGNOSIS — M25561 Pain in right knee: Secondary | ICD-10-CM

## 2023-11-06 DIAGNOSIS — G8929 Other chronic pain: Secondary | ICD-10-CM | POA: Diagnosis not present

## 2023-11-06 DIAGNOSIS — M1711 Unilateral primary osteoarthritis, right knee: Secondary | ICD-10-CM | POA: Diagnosis not present

## 2023-11-06 NOTE — Progress Notes (Signed)
 The patient comes today for follow-up for her right knee pain.  She does report the knee is feeling better since the steroid injection we placed in her knee about 6 weeks ago.  We had recommended outpatient physical therapy but she did not attend.  Her daughter is going to look into that.  Her daughter does interpret for her.  She does have Voltaren gel at home but has not tried this on the medial aspect of her knee where she hurts.  She points to the pes bursa area of that knee where she hurts.  We know that she does have moderate arthritis in her knee and I do believe that the Voltaren gel will help this area if she can apply this to where she is most painful 2-3 times a day at the right knee.  At this point follow-up can be as needed.  The steroid injection did elevate her blood glucose some so we will hold off on any other type of steroid injection.  In 6 weeks now we can always place one over the pes bursa if she is still having issues.  We can also set her up for outpatient physical therapy again for any modalities that can decrease her right knee pain if they would like us  to.

## 2024-02-04 ENCOUNTER — Other Ambulatory Visit: Payer: Self-pay | Admitting: Family Medicine

## 2024-02-04 DIAGNOSIS — Z1231 Encounter for screening mammogram for malignant neoplasm of breast: Secondary | ICD-10-CM

## 2024-02-12 ENCOUNTER — Ambulatory Visit
Admission: RE | Admit: 2024-02-12 | Discharge: 2024-02-12 | Disposition: A | Discharge status: Home / Self Care | Source: Ambulatory Visit | Attending: Family Medicine | Admitting: Family Medicine

## 2024-02-12 DIAGNOSIS — Z1231 Encounter for screening mammogram for malignant neoplasm of breast: Secondary | ICD-10-CM
